# Patient Record
Sex: Female | Born: 1935 | Race: White | Hispanic: No | Smoking: Never smoker
Health system: Southern US, Community
[De-identification: ages and names within clinical notes are randomized; demographics above are authoritative.]

## PROBLEM LIST (undated history)

## (undated) DIAGNOSIS — Z853 Personal history of malignant neoplasm of breast: Secondary | ICD-10-CM

## (undated) DIAGNOSIS — N189 Chronic kidney disease, unspecified: Secondary | ICD-10-CM

## (undated) DIAGNOSIS — D649 Anemia, unspecified: Secondary | ICD-10-CM

## (undated) DIAGNOSIS — I739 Peripheral vascular disease, unspecified: Secondary | ICD-10-CM

## (undated) DIAGNOSIS — Z803 Family history of malignant neoplasm of breast: Secondary | ICD-10-CM

## (undated) DIAGNOSIS — Z801 Family history of malignant neoplasm of trachea, bronchus and lung: Secondary | ICD-10-CM

## (undated) DIAGNOSIS — N183 Chronic kidney disease, stage 3 unspecified: Secondary | ICD-10-CM

## (undated) DIAGNOSIS — Z923 Personal history of irradiation: Secondary | ICD-10-CM

## (undated) DIAGNOSIS — I442 Atrioventricular block, complete: Secondary | ICD-10-CM

## (undated) DIAGNOSIS — C50919 Malignant neoplasm of unspecified site of unspecified female breast: Secondary | ICD-10-CM

## (undated) DIAGNOSIS — M1990 Unspecified osteoarthritis, unspecified site: Secondary | ICD-10-CM

## (undated) DIAGNOSIS — I209 Angina pectoris, unspecified: Secondary | ICD-10-CM

## (undated) DIAGNOSIS — I10 Essential (primary) hypertension: Secondary | ICD-10-CM

## (undated) DIAGNOSIS — Z950 Presence of cardiac pacemaker: Secondary | ICD-10-CM

## (undated) DIAGNOSIS — Z1379 Encounter for other screening for genetic and chromosomal anomalies: Secondary | ICD-10-CM

## (undated) DIAGNOSIS — R42 Dizziness and giddiness: Secondary | ICD-10-CM

## (undated) DIAGNOSIS — E785 Hyperlipidemia, unspecified: Secondary | ICD-10-CM

## (undated) DIAGNOSIS — E7800 Pure hypercholesterolemia, unspecified: Secondary | ICD-10-CM

## (undated) DIAGNOSIS — C50412 Malignant neoplasm of upper-outer quadrant of left female breast: Secondary | ICD-10-CM

## (undated) DIAGNOSIS — Z170 Estrogen receptor positive status [ER+]: Secondary | ICD-10-CM

## (undated) DIAGNOSIS — M810 Age-related osteoporosis without current pathological fracture: Secondary | ICD-10-CM

## (undated) HISTORY — PX: GANGLION CYST EXCISION: SHX1691

## (undated) HISTORY — DX: Chronic kidney disease, stage 3 unspecified: N18.30

## (undated) HISTORY — DX: Pure hypercholesterolemia, unspecified: E78.00

## (undated) HISTORY — PX: EXCISION MORTON'S NEUROMA: SHX5013

## (undated) HISTORY — PX: ABDOMINAL HYSTERECTOMY: SHX81

## (undated) HISTORY — DX: Peripheral vascular disease, unspecified: I73.9

## (undated) HISTORY — DX: Anemia, unspecified: D64.9

## (undated) HISTORY — DX: Hyperlipidemia, unspecified: E78.5

## (undated) HISTORY — DX: Chronic kidney disease, stage 3 (moderate): N18.3

## (undated) HISTORY — PX: APPENDECTOMY: SHX54

## (undated) HISTORY — DX: Personal history of malignant neoplasm of breast: Z85.3

## (undated) HISTORY — DX: Presence of cardiac pacemaker: Z95.0

## (undated) HISTORY — DX: Family history of malignant neoplasm of trachea, bronchus and lung: Z80.1

## (undated) HISTORY — DX: Estrogen receptor positive status (ER+): Z17.0

## (undated) HISTORY — DX: Dizziness and giddiness: R42

## (undated) HISTORY — DX: Angina pectoris, unspecified: I20.9

## (undated) HISTORY — DX: Encounter for other screening for genetic and chromosomal anomalies: Z13.79

## (undated) HISTORY — DX: Family history of malignant neoplasm of breast: Z80.3

## (undated) HISTORY — DX: Malignant neoplasm of upper-outer quadrant of left female breast: C50.412

## (undated) HISTORY — PX: TOTAL HIP ARTHROPLASTY: SHX124

---

## 1983-10-13 DIAGNOSIS — Z923 Personal history of irradiation: Secondary | ICD-10-CM

## 1983-10-13 HISTORY — PX: BREAST LUMPECTOMY: SHX2

## 1983-10-13 HISTORY — DX: Personal history of irradiation: Z92.3

## 1998-02-04 ENCOUNTER — Ambulatory Visit (HOSPITAL_COMMUNITY): Admission: RE | Admit: 1998-02-04 | Discharge: 1998-02-04 | Payer: Self-pay | Admitting: Family Medicine

## 1998-04-08 ENCOUNTER — Ambulatory Visit (HOSPITAL_COMMUNITY): Admission: RE | Admit: 1998-04-08 | Discharge: 1998-04-08 | Payer: Self-pay | Admitting: Family Medicine

## 1998-07-24 ENCOUNTER — Other Ambulatory Visit: Admission: RE | Admit: 1998-07-24 | Discharge: 1998-07-24 | Payer: Self-pay | Admitting: Family Medicine

## 1998-09-04 ENCOUNTER — Ambulatory Visit (HOSPITAL_COMMUNITY): Admission: RE | Admit: 1998-09-04 | Discharge: 1998-09-04 | Payer: Self-pay | Admitting: Family Medicine

## 1999-08-01 ENCOUNTER — Other Ambulatory Visit: Admission: RE | Admit: 1999-08-01 | Discharge: 1999-08-01 | Payer: Self-pay | Admitting: Family Medicine

## 1999-09-05 ENCOUNTER — Encounter: Admission: RE | Admit: 1999-09-05 | Discharge: 1999-09-05 | Payer: Self-pay | Admitting: Family Medicine

## 1999-09-05 ENCOUNTER — Encounter: Payer: Self-pay | Admitting: Family Medicine

## 2000-11-04 ENCOUNTER — Encounter: Payer: Self-pay | Admitting: Family Medicine

## 2000-11-04 ENCOUNTER — Encounter: Admission: RE | Admit: 2000-11-04 | Discharge: 2000-11-04 | Payer: Self-pay | Admitting: Family Medicine

## 2001-01-31 ENCOUNTER — Encounter (INDEPENDENT_AMBULATORY_CARE_PROVIDER_SITE_OTHER): Payer: Self-pay

## 2001-01-31 ENCOUNTER — Ambulatory Visit (HOSPITAL_COMMUNITY): Admission: RE | Admit: 2001-01-31 | Discharge: 2001-01-31 | Payer: Self-pay | Admitting: Gastroenterology

## 2001-08-23 ENCOUNTER — Other Ambulatory Visit: Admission: RE | Admit: 2001-08-23 | Discharge: 2001-08-23 | Payer: Self-pay | Admitting: Obstetrics and Gynecology

## 2001-11-07 ENCOUNTER — Encounter: Admission: RE | Admit: 2001-11-07 | Discharge: 2001-11-07 | Payer: Self-pay | Admitting: Obstetrics and Gynecology

## 2001-11-07 ENCOUNTER — Encounter: Payer: Self-pay | Admitting: Obstetrics and Gynecology

## 2002-09-11 ENCOUNTER — Other Ambulatory Visit: Admission: RE | Admit: 2002-09-11 | Discharge: 2002-09-11 | Payer: Self-pay | Admitting: *Deleted

## 2002-12-19 ENCOUNTER — Encounter: Admission: RE | Admit: 2002-12-19 | Discharge: 2002-12-19 | Payer: Self-pay | Admitting: *Deleted

## 2003-12-28 ENCOUNTER — Encounter: Admission: RE | Admit: 2003-12-28 | Discharge: 2003-12-28 | Payer: Self-pay | Admitting: *Deleted

## 2005-01-13 ENCOUNTER — Encounter: Admission: RE | Admit: 2005-01-13 | Discharge: 2005-01-13 | Payer: Self-pay | Admitting: *Deleted

## 2005-12-24 ENCOUNTER — Encounter: Admission: RE | Admit: 2005-12-24 | Discharge: 2005-12-24 | Payer: Self-pay | Admitting: *Deleted

## 2006-01-05 ENCOUNTER — Encounter: Admission: RE | Admit: 2006-01-05 | Discharge: 2006-01-05 | Payer: Self-pay | Admitting: Cardiology

## 2006-02-18 ENCOUNTER — Encounter: Admission: RE | Admit: 2006-02-18 | Discharge: 2006-02-18 | Payer: Self-pay | Admitting: *Deleted

## 2006-12-30 ENCOUNTER — Encounter: Admission: RE | Admit: 2006-12-30 | Discharge: 2006-12-30 | Payer: Self-pay | Admitting: *Deleted

## 2007-07-07 ENCOUNTER — Encounter: Admission: RE | Admit: 2007-07-07 | Discharge: 2007-07-07 | Payer: Self-pay | Admitting: Sports Medicine

## 2007-11-02 ENCOUNTER — Inpatient Hospital Stay (HOSPITAL_COMMUNITY): Admission: RE | Admit: 2007-11-02 | Discharge: 2007-11-06 | Payer: Self-pay | Admitting: Orthopedic Surgery

## 2008-01-02 ENCOUNTER — Encounter: Admission: RE | Admit: 2008-01-02 | Discharge: 2008-01-02 | Payer: Self-pay | Admitting: Obstetrics and Gynecology

## 2008-10-08 ENCOUNTER — Inpatient Hospital Stay (HOSPITAL_COMMUNITY): Admission: RE | Admit: 2008-10-08 | Discharge: 2008-10-11 | Payer: Self-pay | Admitting: Orthopedic Surgery

## 2008-10-12 HISTORY — PX: JOINT REPLACEMENT: SHX530

## 2008-10-17 ENCOUNTER — Ambulatory Visit: Payer: Self-pay | Admitting: Vascular Surgery

## 2008-10-17 ENCOUNTER — Encounter (INDEPENDENT_AMBULATORY_CARE_PROVIDER_SITE_OTHER): Payer: Self-pay | Admitting: Orthopedic Surgery

## 2008-10-17 ENCOUNTER — Ambulatory Visit (HOSPITAL_COMMUNITY): Admission: RE | Admit: 2008-10-17 | Discharge: 2008-10-17 | Payer: Self-pay | Admitting: Orthopedic Surgery

## 2009-01-08 ENCOUNTER — Encounter: Admission: RE | Admit: 2009-01-08 | Discharge: 2009-01-08 | Payer: Self-pay | Admitting: Family Medicine

## 2009-07-04 ENCOUNTER — Encounter: Admission: RE | Admit: 2009-07-04 | Discharge: 2009-07-04 | Payer: Self-pay | Admitting: Family Medicine

## 2009-08-20 ENCOUNTER — Inpatient Hospital Stay (HOSPITAL_COMMUNITY): Admission: RE | Admit: 2009-08-20 | Discharge: 2009-08-22 | Payer: Self-pay | Admitting: Orthopedic Surgery

## 2009-11-10 ENCOUNTER — Ambulatory Visit (HOSPITAL_COMMUNITY): Admission: RE | Admit: 2009-11-10 | Discharge: 2009-11-10 | Payer: Self-pay | Admitting: Emergency Medicine

## 2010-01-13 ENCOUNTER — Encounter: Admission: RE | Admit: 2010-01-13 | Discharge: 2010-01-13 | Payer: Self-pay | Admitting: Family Medicine

## 2010-12-24 ENCOUNTER — Other Ambulatory Visit: Payer: Self-pay | Admitting: Family Medicine

## 2010-12-24 DIAGNOSIS — Z1231 Encounter for screening mammogram for malignant neoplasm of breast: Secondary | ICD-10-CM

## 2010-12-29 LAB — CREATININE, SERUM
Creatinine, Ser: 1.48 mg/dL — ABNORMAL HIGH (ref 0.4–1.2)
GFR calc non Af Amer: 34 mL/min — ABNORMAL LOW (ref >60)

## 2011-01-14 LAB — URINALYSIS, ROUTINE W REFLEX MICROSCOPIC
Glucose, UA: NEGATIVE mg/dL
Hgb urine dipstick: NEGATIVE
Ketones, ur: NEGATIVE mg/dL
Nitrite: NEGATIVE
Specific Gravity, Urine: 1.015 (ref 1.005–1.030)
Urobilinogen, UA: 0.2 mg/dL (ref 0.0–1.0)

## 2011-01-14 LAB — BASIC METABOLIC PANEL
BUN: 19 mg/dL (ref 6–23)
BUN: 23 mg/dL (ref 6–23)
CO2: 27 mEq/L (ref 19–32)
Calcium: 8 mg/dL — ABNORMAL LOW (ref 8.4–10.5)
Chloride: 105 mEq/L (ref 96–112)
Chloride: 110 mEq/L (ref 96–112)
GFR calc Af Amer: 43 mL/min — ABNORMAL LOW (ref >60)
GFR calc Af Amer: 52 mL/min — ABNORMAL LOW (ref >60)
GFR calc non Af Amer: 36 mL/min — ABNORMAL LOW (ref >60)
GFR calc non Af Amer: 39 mL/min — ABNORMAL LOW (ref >60)
GFR calc non Af Amer: 43 mL/min — ABNORMAL LOW (ref >60)
Glucose, Bld: 101 mg/dL — ABNORMAL HIGH (ref 70–99)
Glucose, Bld: 102 mg/dL — ABNORMAL HIGH (ref 70–99)
Glucose, Bld: 106 mg/dL — ABNORMAL HIGH (ref 70–99)
Potassium: 4.3 mEq/L (ref 3.5–5.1)
Potassium: 5.1 mEq/L (ref 3.5–5.1)

## 2011-01-14 LAB — CBC
HCT: 22.6 % — ABNORMAL LOW (ref 36.0–46.0)
HCT: 25.1 % — ABNORMAL LOW (ref 36.0–46.0)
HCT: 33.5 % — ABNORMAL LOW (ref 36.0–46.0)
Hemoglobin: 11.4 g/dL — ABNORMAL LOW (ref 12.0–15.0)
Hemoglobin: 8.6 g/dL — ABNORMAL LOW (ref 12.0–15.0)
MCHC: 34 g/dL (ref 30.0–36.0)
MCV: 97.3 fL (ref 78.0–100.0)
MCV: 98.6 fL (ref 78.0–100.0)
MCV: 99.1 fL (ref 78.0–100.0)
Platelets: 169 10*3/uL (ref 150–400)
RDW: 12.4 % (ref 11.5–15.5)
RDW: 13.6 % (ref 11.5–15.5)
RDW: 13.9 % (ref 11.5–15.5)
WBC: 5.8 10*3/uL (ref 4.0–10.5)

## 2011-01-14 LAB — PROTIME-INR: INR: 0.98 (ref 0.00–1.49)

## 2011-01-14 LAB — TYPE AND SCREEN: ABO/RH(D): O POS

## 2011-01-14 LAB — DIFFERENTIAL
Eosinophils Absolute: 0.2 10*3/uL (ref 0.0–0.7)
Lymphocytes Relative: 34 % (ref 12–46)
Monocytes Absolute: 0.4 10*3/uL (ref 0.1–1.0)
Neutro Abs: 3.1 10*3/uL (ref 1.7–7.7)

## 2011-01-14 LAB — APTT: aPTT: 29 seconds (ref 24–37)

## 2011-01-16 ENCOUNTER — Ambulatory Visit: Payer: Self-pay

## 2011-02-17 ENCOUNTER — Ambulatory Visit
Admission: RE | Admit: 2011-02-17 | Discharge: 2011-02-17 | Disposition: A | Discharge status: Home / Self Care | Payer: Medicare Other | Source: Ambulatory Visit | Attending: Family Medicine | Admitting: Family Medicine

## 2011-02-17 DIAGNOSIS — Z1231 Encounter for screening mammogram for malignant neoplasm of breast: Secondary | ICD-10-CM

## 2011-02-24 NOTE — H&P (Signed)
[name], [name]               ACCOUNT NO.:  [id number]   MEDICAL RECORD NO.:  [id number]          PATIENT TYPE:  INP   LOCATION:  NA                           FACILITY:  [facility]   PHYSICIAN:  Ollen Gross, M.D.    DATE OF BIRTH:  1929-04-08   DATE OF ADMISSION:  11/02/2007  DATE OF DISCHARGE:                              HISTORY & PHYSICAL   CHIEF COMPLAINT:  Left hip pain.   HISTORY OF PRESENT ILLNESS:  The patient is a 77 year old female who has  been seen by Dr. Lequita Halt in second opinion for ongoing left hip pain.  She has had intermittent pain for quite some time now and but over the  past several months it has been increasing in severity.  It is very hard  to get into a comfortable position.  She has been worked up in the past  and had MRI showing large fusions and degenerative changes.  She has  undergone a cortisone injection which would  only help for a few days.  She continues to have progressive pain.  She is seen in the office where  x-rays show essentially very minimal joint space basically contacting  bone-on-bone.  These are compared to films from several months ago and  has shown much deterioration and degeneration in the joint space, and  loss of joint space.  It was felt due to the progression of her disease  that she would benefit undergoing surgical intervention.  The risks and  benefits were discussed.  The patient, as such, was admitted to the  hospital.   ALLERGIES:  ACE INHIBITORS give her a nonproductive cough. COZAAR gives  her headache and vertigo.   CURRENT MEDICATIONS:  1. Atenolol.  2. Micardis.  3. Lipitor.  4. Claritin.  5. Glucosamine chondroitin.  6. Tylenol arthritis.  7. Baby aspirin.  8. Calcium plus D.  9. Vicodin.  10.A muscle relaxant.   PAST MEDICAL HISTORY:  History of bronchitis, hypertension,  hyperlipidemia, varicose veins, history of breast cancer on the left  side, osteopenia, cervical degenerative disk disease.   Childhood  illnesses including measles and mumps.   PAST SURGICAL HISTORY:  Appendectomy, hysterectomy, breast cancer  surgery, neuroma surgery x2.   FAMILY HISTORY:  Father with history of cerebral hemorrhage.  Mother age  19.   SOCIAL HISTORY:  Divorced, retired, nonsmoker, occasional intake of  wine. Three children living.  She lives alone. One of her children will  be assisting her with her care. She lives in a condo with one level.   REVIEW OF SYSTEMS:  GENERAL:  No fevers, chills, night sweats.  NEURO:  No seizures, syncope or paralysis.  A little bit of mild hearing loss.  RESPIRATORY:  No shortness breath, productive cough or hemoptysis.  CARDIOVASCULAR: No chest pain, angina, orthopnea.  GI: No nausea,  vomiting, diarrhea or constipation.  GU: A little bit of urinary  frequency.  No dysuria or hematuria.  MUSCULOSKELETAL:  Morning  stiffness and joint pain.   PHYSICAL EXAMINATION:  VITAL SIGNS: Pulse 52, respirations 12, blood  pressure 135/60.  GENERAL: This is  a 77 year old female well-nourished, well-developed, in  no acute distress, alert, oriented and cooperative, pleasant.  Excellent  historian.  She is accompanied by her daughter.  HEENT: Normocephalic, atraumatic.  Pupils round and reactive.  Oropharynx clear.  EOMs intact.  NECK:  Supple.  CHEST: Clear anterior posterior chest walls.  HEART:  Regular rate and rhythm.  Very faint early systolic ejection  murmur best heard over aortic point.  S1, S2 is noted.  ABDOMEN: Soft, nontender.  Bowel sounds present.  GENITALIA:  Not done.  EXTREMITIES:  Left hip flexion 90, zero internal rotation, about 20  degrees external rotation and about 20 degrees abduction.   IMPRESSION:  1. Osteoarthritis left hip.  2. History of bronchitis.  3. Hypertension.  4. Hyperlipidemia.  5. History of breast cancer.  6. Osteopenia.  7. Cervical degenerative disk disease.   PLAN:  The patient was admitted to [facility]  to undergo a  left total hip replacement arthroplasty.  Surgery will be performed by  Dr. Ollen Gross.      Alexzandrew L. Perkins, P.A.C.      Ollen Gross, M.D.  Electronically Signed    ALP/MEDQ  D:  11/01/2007  T:  11/02/2007  Job:  [id number]   cc:   Ollen Gross, M.D.  Fax: [phone]   W. Viann Fish, M.D.  Fax: [phone]  Email: [email or web address]

## 2011-02-24 NOTE — Op Note (Signed)
[name], [name]               ACCOUNT NO.:  [id number]   MEDICAL RECORD NO.:  [id number]          PATIENT TYPE:  INP   LOCATION:  0003                         FACILITY:  [facility]   PHYSICIAN:  Madlyn Frankel. Charlann Boxer, M.D.  DATE OF BIRTH:  19-Dec-1928   DATE OF PROCEDURE:  10/08/2008  DATE OF DISCHARGE:                               OPERATIVE REPORT   PREOPERATIVE DIAGNOSIS:  Right hip osteoarthritis.   POSTOPERATIVE DIAGNOSIS:  Right hip osteoarthritis.   PROCEDURE:  Right total hip replacement.   COMPONENTS USED:  DePuy hip system size 54 Pinnacle cup, a 32 neutral  Marathon liner, 2 cancellous bone screws, a 6 high Trilock stem with a  32 +1 ball.   SURGEON:  Madlyn Frankel. Charlann Boxer, M.D.   ASSISTANT:  Yetta Glassman. Loreta Ave, PA   ANESTHESIA:  General.   BLOOD LOSS:  300.   DRAINS:  One.   COMPLICATIONS.:  None.   SPECIMEN:  None.   FINDINGS:  None.   INDICATION FOR THE PROCEDURE:  [name] is a very pleasant 77 year old  female with a successful left total hip placement.  She had progressive  pain in the right hip that was not responding to conservative measures.  She had been a patient of Dr. Ollen Gross and wished to have things  done a bit earlier than he could do on the schedule.  She was seen and  evaluated for that purpose.  Risks and benefits of hip replacement  procedure were discussed and reviewed.  Consent was obtained for the  above.   PROCEDURE IN DETAIL:  The patient was brought to the operative theater.  Once adequate anesthesia, preoperative antibiotics, Ancef administered,  the patient was positioned in the left lateral decubitus position with  the right side up carefully.  Bony prominences were padded.  The patient  was positioned in the pegboard positioner with the pelvis stable and  thorax stable.  We evaluated her lower extremity lengths both in a  supine and then in this lateral position in order to match her leg  lengths at the end of the case.  Once this  was done, time-out was  performed, identifying the patient and extremity.  We draped out the  perineum area with plastic drapes.  Prescrubbed and prepped and draped  the right hip.   The lateral-based incision was made for a posterior approach to the hip.  The iliotibial band and gluteal fascia were incised posteriorly.  The  short external rotators were identified and taken down seperately from  the posterior capsule.  An L capsulotomy was made preserving the  posterior capsule leaflet for later anatomic repair but also to protect  the sciatic nerve from retractors.   The head was dislocated.  I utilized a high offset neck with a 28 ball  to make a neck osteotomy, as she was relatively similar in her leg  lengths.  Following this neck osteotomy, I attended to the femur first.  The femoral canal was opened with a drill, then the hand reamer, was  then irrigated to prevent fat emboli.  I then began  broaching with a 2  broach set in anteversion of about 20 degrees.  I broached up initially  all the way to a size 5 using the calcar planer to finish off a bit of  the anterior neck cut.   At this point I packed the femur with a sponge and attended to the  acetabulum.  Acetabular exposure was obtained,  retractors placed,  labrum debrided.  I began reaming with the 44 reamer and went up  sequentially to a 49 reamer and then 51.  The other hip had had a 52-mm  cup, however, at this point in my reaming there was still sclerotic  bone.  I decided to ream up to a 53 reamer.  With this I was able to  expose more of the subchondral bone, giving me a better chance of bony  ingrowth.  I impacted a 54 cup, putting it in approximately 40 degrees  of abduction and 20 degrees forward flexion, positioned anatomically so  that it was beneath the anterior rim anteriorly, and there was a bit of  the cup exposed superolateral.  With this I went ahead and placed 2  cancellous screws to support the initial  scratch fit.  I placed a  neutral trial liner.  Trial reduction was now carried out with this 5  broach, 5 offset neck and a 32-1 ball.  With this the leg length seemed  to be a little bit short on this side, there was a few millimeters of  shuck.  I went ahead and removed these trial components, removed the  trial liner, placed a central hole eliminator, and placed the final 32  neutral liner as my hip range of motion was excellent without evidence  of impingement.   The final 32 neutral liner was impacted without difficulty, visualized  seating of the plastic.   We now attended back to the femur.  I went ahead and broached with the 5  a bit lower and then impacted the  size 6 broach.  This sized the level  of my neck cut.  We chose to open up the 6 high offset stem.  It was  impacted at the level of the neck cut.  I retrialed the 32-1 ball.  At  this point there was about a millimeter or so of shuck, the leg lengths  appeared to be exactly as we had measured in the preoperative  positioning.  The hip range of motion was excellent with the combined  anteversion of approximately 50 degrees, no evidence of impingement with  external rotation, extension, abduction, external rotation, and no  evidence of subluxation with forward flexion and internal rotation.  Given these parameters, the final 32 +1 ball was impacted under a clean  and dry trunnion.  The hip was reduced.  We had irrigated the hip  throughout the case and again at this point.  I reapproximated posterior  capsule to the superior leaflet of tissue with a #1 Vicryl.  I placed a  medium Hemovac drain deep and reapproximated the iliotibial band and  gluteal fascia using #1 Vicryl.  The remainder of the wound was closed  with 2-0 Vicryl and running 4-0 Monocryl.  The hip was cleaned, dried,  and dressed sterilely with Steri-Strips and a Mepilex dressing.  She was  then brought to the recovery room in stable condition, tolerated  the  procedure well.      Madlyn Frankel Charlann Boxer, M.D.  Electronically Signed  MDO/MEDQ  D:  10/08/2008  T:  10/08/2008  Job:  [id number]

## 2011-02-24 NOTE — Discharge Summary (Signed)
NAMESTAYCE, DELANCY               ACCOUNT NO.:  [id number]   MEDICAL RECORD NO.:  [id number]          PATIENT TYPE:  INP   LOCATION:  1607                         FACILITY:  [facility]   PHYSICIAN:  Ollen Gross, M.D.    DATE OF BIRTH:  28-Jan-1929   DATE OF ADMISSION:  11/02/2007  DATE OF DISCHARGE:  11/06/2007                               DISCHARGE SUMMARY   ADMITTING DIAGNOSIS:  1. Osteoarthritis, left hip.  2. History of bronchitis.  3. Hypertension.  4. Hyperlipidemia.  5. History of breast cancer.  6. Osteopenia.  7. Cervical degenerative disk disease.   DISCHARGE DIAGNOSIS:  1. Osteoarthritis left hip status post left total hip arthroplasty.  2. Preop anemia.  3. Acute blood loss anemia on top of probable chronic anemia.  4. Status post transfusion without sequelae.  5. Postop hyponatremia, improving.  6. History of bronchitis.  7. Hypertension.  8. Hyperlipidemia.  9. History of breast cancer.  10.Osteopenia.  11.Cervical degenerative disk disease.   PROCEDURE:  November 02, 2007 left total hip.  Surgeon Dr. Lequita Halt,  assistant Avel Peace, PA-C.   ANESTHESIA:  General.   CONSULTS:  None.   BRIEF HISTORY:  Ms. Bellino is a 77 year old female with end-stage  arthritis of the left hip with aggressive worsening pain and dysfunction  who now presents for a total hip arthroplasty.   LABORATORY DATA:  Preop CBC showed a low hemoglobin of 11.6, hematocrit  of 33.5, white cell count 7.9, platelets elevated at 453. Serial CBCs  were followed.  Hemoglobin did drop down to 8.3. She was given 2 units  of blood back up to 9.6, drifted back down. Last noted H&H was 8.8 and  25.1.  PT/PTT preop 13.9 and .33 respectively.  INR 1.1.  Serial  protimes followed.  Last noted PT/INR 38.0 and 3.7.  Chem panel on  admission slightly elevated BUN of 25 and elevated creatinine 1.3.  Remaining Chem panel all within normal limits.  Serial BMETs were  followed.  BUN came down to normal  levels of 13, creatinine came down to  normal level of 1.06.  Sodium started out at 138, drifted down to 129  and  was improving.  Had come back up already to 132.  Preop UA, small  leukocyte esterase, 0-2 white cells, otherwise negative.  Blood group  type O+.   X-RAYS:  Left hip film October 27, 2007, left worse than right  osteoarthritis, no acute findings.  Chest x-ray preop October 27, 2007  no acute disease.  Postop hip pelvis film no complications after left  total hip.   EKG dated September 15, 2007 sinus rhythm was unconfirmed.   Stress test dated January 06, 2006 normal stress Cardiolite study with no  evidence of ischemia or infarction, normal quantitative gated SPECT,  ejection fraction of 73% with normal wall motion and normal wall  thickening   HOSPITAL COURSE:  The patient admitted to [facility],  tolerated procedure well, later transferred to the recovery room on the  orthopedic floor started on PCA and p.o. analgesics for pain control  following surgery.  She was actually doing pretty well on the morning of  day one. She did very well through the night after surgery with  virtually no pain.  She was noted to have chronic anemia preop. We did  have be give her blood and continue her iron supplement because her  hemoglobin dropped down to 8.3.  She did have excellent urinary output.  Her sodium was down a little bit. She was diuresing her fluids very  well. Resumed he blood pressure meds but they were put on parameters  since she was normotensive postop without having taken them the first  day. By day two what little pain she did have was improved, dressing was  changed, the incision looked good.  The sodium was down a little bit  further. On January 29, we discontinued her fluids. Hemoglobin was back  up to 9.6.  She was getting up and walking about 70 feet, progressing  well with therapy.  She continued to receive therapy on a daily basis  through the weekend.   Sodium was still a little low on postop day #3.  INR was slowly titrating up. She continued to progress with therapy and  by postop day #4 sodium had turned around and had improved up to 132.  She was ambulating well with physical therapy, no complaints and was  discharged home.   DISCHARGE PLAN:  1. The patient discharged on November 06, 2007.  2. Discharge diagnoses, please see above.  3. Discharge medications:  Percocet, Robaxin, Nu-Iron, Coumadin.   ACTIVITY:  Partial weightbearing left lower extremity.  Home health PT,  home health nursing, total hip protocol, hip cautions, may start  showering, do not submerge the incision under water.  Follow-up in 2  weeks.   DISPOSITION:  Home.   CONDITION ON DISCHARGE:  Improving.      Alexzandrew L. Perkins, P.A.C.      Ollen Gross, M.D.  Electronically Signed    ALP/MEDQ  D:  12/20/2007  T:  12/21/2007  Job:  [id number]   cc:   Georga Hacking, M.D.  Fax: [phone]  Email: [email or web address]

## 2011-02-24 NOTE — Op Note (Signed)
[name], [name]               ACCOUNT NO.:  [id number]   MEDICAL RECORD NO.:  [id number]          PATIENT TYPE:  INP   LOCATION:  0005                         FACILITY:  [facility]   PHYSICIAN:  Ollen Gross, M.D.    DATE OF BIRTH:  [birth year]   DATE OF PROCEDURE:  11/02/2007  DATE OF DISCHARGE:                               OPERATIVE REPORT   PREOPERATIVE DIAGNOSIS:  Osteoarthritis, left hip.   POSTOPERATIVE DIAGNOSIS:  Osteoarthritis, left hip   PROCEDURE:  Left total hip arthroplasty.   SURGEON:  Ollen Gross, M.D.   ASSISTANT:  Alexzandrew L. Perkins, P.A.C.   ANESTHESIA:  General.   ESTIMATED BLOOD LOSS:  500 mL.   DRAINS:  Hemovac x1.   COMPLICATIONS:  None.   CONDITION:  Stable to the recovery room.   CLINICAL NOTE:  [name] is a 77 year old female with end stage  arthritis of the left hip with progressively worsening pain and  dysfunction.  She presents now for left total hip arthroplasty.   PROCEDURE IN DETAIL:  After the successful administration of general  anesthetic, the patient is placed in the right lateral decubitus  position with the left side up and held with the hip positioner.  The  left lower extremity isolated from her perineum with plastic drapes and  prepped and draped in the usual sterile fashion.  A short posterolateral  incision is made with a 10 blade through subcutaneous tissue to the  level of the fascia lata which was incised in line with the skin  incision.  The sciatic nerve is palpated and protected and the short  rotators isolated off the femur.  Capsulectomy is performed and the hip  was dislocated.  The center of the femoral head is marked and a trial  prosthesis was placed such that the center of the trial head corresponds  to the center of native femoral head.  The osteotomy line is marked on  the femoral neck and osteotomy made with an oscillating saw.  The  femoral head is removed and the femur retracted anteriorly to gain  acetabular exposure.   The acetabular retractors were placed and labrum and osteophytes were  removed.  The reaming begins at 45 mm in coursing increments of 2 up to  51 mm then a 52 mm Pinnacle acetabular shell was placed in anatomic  position and transfixed with two dome screws.  A trial 32 mm neutral  liner was placed.   The femur was prepared with the canal finder and irrigation.  Axial  reaming is performed up to 13.5 mm, proximal reaming to an 21F and the  sleeve machined to a large.  An 21F large trial sleeve is placed with an  18 x 13 stem and a 36 plus 8 neck matching native anteversion.  A 32  plus 0 head is placed.  The hip is reduced with excellent stability.  There was full extension, flexion, external rotation, 70 degrees  flexion, 40 degrees adduction, 90 degrees internal rotation, and 90  degrees of flexion and 70 degrees of internal rotation.  By placing the  left  hip on top of the right, it felt as though the leg lengths were  equal.   The hip was dislocated and all trials were removed.  The permanent apex  hole eliminator is placed and the permanent 32 mm neutral liner is  placed.  On the femoral ,side we placed the 51F large sleeve with the 18  x 13 stem and 36 plus 8 neck matching native anteversion.  The 32 plus 0  head is placed and the hip is reduced with the same stability  parameters.  The wound was copiously irrigated with saline solution and  the short rotators reattached to the femur through drill holes.  The  fascia lata was closed over with a Hemovac drain with interrupted #1  Vicryl, subcu closed with #1 and 2-0 Vicryl and subcuticular running 4-0  Monocryl.  The drain was hooked to suction, the incision cleaned and  dried, and Steri-Strips and a bulky sterile dressing applied.  The  patient was placed into a knee immobilizer, awakened, and transferred to  the recovery room in stable condition.      Ollen Gross, M.D.  Electronically  Signed     FA/MEDQ  D:  11/02/2007  T:  11/02/2007  Job:  [id number]

## 2011-02-27 NOTE — H&P (Signed)
NAMESKY, PRIMO NO.:  [id number]   MEDICAL RECORD NO.:  [id number]          PATIENT TYPE:  INP   LOCATION:                               FACILITY:  [facility]   PHYSICIAN:  Madlyn Frankel. Charlann Boxer, M.D.  DATE OF BIRTH:  August 06, 1929   DATE OF ADMISSION:  10/08/2008  DATE OF DISCHARGE:                              HISTORY & PHYSICAL   PROCEDURE:  Right total hip replacement.   CHIEF COMPLAINT:  Right hip pain.   HISTORY OF PRESENT ILLNESS:  A 77 year old female with a history of  right hip pain secondary to osteoarthritis.  She does have a history of  a left total hip replacement without any complications.  Her right hip  has been refractory to all conservative treatment.  She has been  presurgically assessed prior to her surgery.   PRIMARY CARE PHYSICIAN:  Juluis Rainier, MD   CARDIOLOGIST:  Georga Hacking, MD   PAST MEDICAL HISTORY:  Significant for:  1. Osteoarthritis.  2. Hypertension.  3. Hypercholesterolemia.  4. Breast cancer.  5. Urinary incontinence.   PAST SURGICAL HISTORY:  Appendectomy, ganglion cyst removal, Morton  neuroma removal, hysterectomy, breast cancer removal, left total hip  replacement in January of 2009.   FAMILY MEDICAL HISTORY:  Cerebrovascular accident (CVA), cancer.   SOCIAL HISTORY:  She is divorced and retired.  She does have a primary  caregiver in the home postoperatively.   DRUG ALLERGIES:  No known drug allergies.   MEDICATIONS:  1. Atenolol 25 mg one p.o. daily.  2. Micardis/HCTZ 80 mg 1/2 tab daily.  3. Lipitor 20 mg 1/2 tab daily.  4. Aspirin 81 mg p.o. daily.  5. Glucosamine chondroitin 2 daily.  6. Calcium plus vitamin D p.o. b.i.d.  7. Omega-3 1000 mg p.o. daily.  8. Claritin 10 mg one p.o. daily.  9. Tylenol Arthritis two p.o. p.r.n.   REVIEW OF SYSTEMS:  NEUROLOGICAL:  She has some balance problems.  DERMATOLOGY:  She has hives, allergic to mold.  GENITOURINARY:  She has  increased urinary frequency,  incontinence and increased urinating at  night.  MUSCULOSKELETAL:  She has muscle stiffness.  Otherwise see HPI.   PHYSICAL EXAMINATION:  VITAL SIGNS:  Pulse 72, respirations 16, blood  pressure 124/76.  GENERAL:  Awake, alert and oriented, well-developed, well-nourished.  HEENT:  Normocephalic.  NECK:  Supple.  No carotid bruits.  CHEST:  Lungs clear to auscultation bilaterally.  BREASTS:  Deferred.  HEART:  Regular rate and rhythm, S1, S2 distinct.  ABDOMEN:  Soft, nontender, nondistended.  Bowel sounds present.  GENITOURINARY:  Deferred.  EXTREMITIES:  Right hip has decreased range of motion and increased pain  with motion.  SKIN:  No cellulitis.  NEUROLOGICAL:  Intact distal sensibilities.   LABS:  Pending presurgical testing.   Cardiology, EKG and adenosine Cardiolite workup done by Dr. Donnie Aho.  This was due to EKG showing some minor nonspecific ST changes.  There  were no significant changes with adenosine infusion.  Recommendations  were though to decrease her atenolol to 25 mg daily in light of her  resting bradycardia.  Chest x-ray is pending.   IMPRESSION:  Right hip osteoarthritis.   PLAN:  Right total hip replacement at [facility] October 08, 2008, by surgeon Dr. Shelda Pal.  All risks and complications were  discussed.  Postoperative medications were provided today which include  Lovenox, Robaxin, iron, aspirin, Colace, MiraLax.  Pain medicines will  be dispensed at time of surgery.  The patient does request that no  occupational therapy be performed in the hospital.     ______________________________  Yetta Glassman. Loreta Ave, [place]      Madlyn Frankel. Charlann Boxer, M.D.  Electronically Signed    BLM/MEDQ  D:  09/20/2008  T:  09/20/2008  Job:  [id number]   cc:   Dossie Der, MD  Fax: [phone]   W. Viann Fish, M.D.  Fax: [phone]  Email: [email or web address]   Juluis Rainier, M.D.  Fax: [phone]

## 2011-02-27 NOTE — Discharge Summary (Signed)
[name], [name]               ACCOUNT NO.:  [id number]   MEDICAL RECORD NO.:  [id number]          PATIENT TYPE:  INP   LOCATION:  1602                         FACILITY:  [facility]   PHYSICIAN:  Madlyn Frankel. Charlann Boxer, M.D.  DATE OF BIRTH:  1929-10-02   DATE OF ADMISSION:  10/08/2008  DATE OF DISCHARGE:  10/11/2008                               DISCHARGE SUMMARY   ADMITTING DIAGNOSES:  1. Osteoarthritis.  2. Hypertension.  3. Hypercholesteremia.  4. Breast cancer.  5. Urinary incontinence.   DISCHARGE DIAGNOSES:  1. Osteoarthritis.  2. Hypertension.  3. Hypercholesteremia.  4. Breast cancer.  5. Urinary incontinence.  6. Acute blood loss anemia, treated with iron.   HISTORY OF PRESENT ILLNESS:  A 77 year old female with a history of  right hip pain secondary to osteoarthritis.  Has a history of a left  total hip replacement, has done well without any complication.  Her  right hip has been refractory to all conservative treatment.   CONSULTS:  None.   PROCEDURE:  A right total hip replacement by surgeon Dr. Durene Romans.  Assistant Dwyane Luo, P.A.-C.   LABORATORY DATA:  Labs, CBC final reading, white blood cell 10.1,  hemoglobin 8.2, hematocrit 24.4, platelets 176.  White cell differential  normal.  Coags normal.  Metabolic panel, sodium 134, potassium 4.2,  glucose 97, creatinine 1.11, GFR 48, calcium 8.9.  UA negative.   RADIOLOGY:  Postoperative portable pelvis showed uncomplicated new right  total hip arthroplasty.   HOSPITAL COURSE:  Patient underwent a right total hip replacement and  admitted to the orthopedic floor.  Her stay was unremarkable.  Pain was  controlled.  She made good progress with physical therapy.  Dressing was  changed on a daily basis.  No significant drainage from the wound.  By  day 3, she met all criteria for discharge home.  She had no hypotension  or any orthostatic hypotension.  Dressing was changed.  He was  weightbearing as tolerated and ready  for discharge home.   DISCHARGE DISPOSITION:  Discharged home in stable and improved  condition.   DISCHARGE PHYSICAL THERAPY:  Weightbearing as tolerated with use of a  rolling walker with home health care PT.   DISCHARGE DIET:  Heart healthy.   DISCHARGE WOUND CARE:  Keep dry.   DISCHARGE MEDICATION:  1. Aspirin 325 mg 1 p.o. b.i.d.  2. Robaxin 500 mg p.o. q.6.  3. Iron 325 mg p.o. t.i.d. x3 weeks.  4. Colace 100 mg p.o. b.i.d.  5. MiraLax 17 g p.o. daily.  6. Norco 7.5/325 one to two p.o. q.4 to 6 p.r.n. pain.  7. Micardis 80/25 one p.o. q.a.m.  8. Atenolol 50 mg 1 p.o. q.h.s.  9. Loratadine 10 mg 1 p.o. q.a.m.  10.Lipitor 20 mg 1/2 tab q.h.s.  11.Vitamin D 1000 units q.a.m.  12.Calcium plus vitamin D p.o. b.i.d.   DISCHARGE FOLLOWUP:  Follow up with Dr. Charlann Boxer at phone number [phone] in  2 weeks for wound check.     ______________________________  Yetta Glassman. [address], [place]      Madlyn Frankel. Charlann Boxer, M.D.  Electronically Signed    BLM/MEDQ  D:  11/05/2008  T:  11/05/2008  Job:  [id number]   cc:   Juluis Rainier, M.D.  Fax: [phone]   W. Viann Fish, M.D.  Fax: [phone]  Email: [email or web address]

## 2011-02-27 NOTE — Procedures (Signed)
[facility]  Patient:    [name], [name]                      MRN: [id number] Proc. Date: 01/31/01 Adm. Date:  [id number] Attending:  Rich Brave CC:         Vale Haven. Andrey Campanile, M.D.   Procedure Report  PROCEDURE:  Colonoscopy with polypectomy.  INDICATIONS FOR PROCEDURE:  A 77 year old female with family history of colon cancer, with negative examination five years ago.  FINDINGS:  Two small polyps removed. Considerable sigmoid diverticulosis and fixation of the colon.  DESCRIPTION OF PROCEDURE:  The nature, purpose and risk of the procedure were familiar from prior examination and she provided written consent. Sedation was fentanyl 75 mcg and Versed 9 mg IV prior to and during the course of the procedure without arrhythmias or desaturation. The Olympus adult video colonoscope was advanced with considerable difficulty due to sigmoid fixation and angulation. Ultimately, with the patient in the supine position and by doing slide by maneuvers cautiously, I was able to get through that area and advance them fairly easily the remainder of the way to the terminal ileum, where upon pull back was initiated.  During the insertion of the scope at about 17 cm, I encountered a small semipedunculated 4 mm polyp which I removed by snare technique without bleeding or evidence of excessive cautery. A small fragment of this lesion, most of which was simply fulgurated away, was retrieved for a histologic analysis from the tip of the snare.  There was also a small sessile polyp at about 60 cm removed by a single cold biopsy.  There was considerable sigmoid diverticulosis and associated fixation of the colon.  No large polyps, cancer, colitis, or vascular malformations were observed and retroflexion in the rectum as well as distal antegrade view into the rectum were unremarkable.  The patient tolerated the procedure well and there were no  apparent complications.  IMPRESSION: 1. Two small polyps removed as described above. 2. Severe sigmoid diverticular disease with associated colonic fixation.  PLAN: 1. Await pathology on the polyps. 2. Follow-up colonoscopy in three to five years in view of the family    history of colon cancer. Would consider use of the pediatric colonoscope    at that time. DD:  01/31/01 TD:  02/01/01 Job: [id number]

## 2011-07-02 LAB — COMPREHENSIVE METABOLIC PANEL
ALT: 14
Albumin: 3.7
Alkaline Phosphatase: 84
GFR calc Af Amer: 46 — ABNORMAL LOW
Potassium: 4.1
Sodium: 138
Total Protein: 7.1

## 2011-07-02 LAB — CBC
MCHC: 34.9
MCHC: 35
MCHC: 35.3
MCV: 92.4
Platelets: 194
Platelets: 207
Platelets: 259
Platelets: 453 — ABNORMAL HIGH
RBC: 2.51 — ABNORMAL LOW
RBC: 2.74 — ABNORMAL LOW
RDW: 12
RDW: 12.2
RDW: 14.1
WBC: 9.2

## 2011-07-02 LAB — BASIC METABOLIC PANEL
BUN: 12
BUN: 13
BUN: 9
BUN: 9
CO2: 27
CO2: 28
CO2: 28
Calcium: 7.8 — ABNORMAL LOW
Calcium: 7.8 — ABNORMAL LOW
Calcium: 7.9 — ABNORMAL LOW
Calcium: 8.5
Creatinine, Ser: 1.02
Creatinine, Ser: 1.06
Creatinine, Ser: 1.07
GFR calc Af Amer: >60
GFR calc Af Amer: >60
GFR calc non Af Amer: 50 — ABNORMAL LOW
GFR calc non Af Amer: 50 — ABNORMAL LOW
Glucose, Bld: 108 — ABNORMAL HIGH
Glucose, Bld: 109 — ABNORMAL HIGH
Glucose, Bld: 97
Potassium: 4.3

## 2011-07-02 LAB — TYPE AND SCREEN: ABO/RH(D): O POS

## 2011-07-02 LAB — PROTIME-INR
INR: 1.1
INR: 1.1
INR: 2.1 — ABNORMAL HIGH
INR: 3.7 — ABNORMAL HIGH
INR: 4.4 — ABNORMAL HIGH
Prothrombin Time: 14.5
Prothrombin Time: 43.8 — ABNORMAL HIGH

## 2011-07-02 LAB — HEMOGLOBIN AND HEMATOCRIT, BLOOD: Hemoglobin: 8.8 — ABNORMAL LOW

## 2011-07-02 LAB — URINALYSIS, ROUTINE W REFLEX MICROSCOPIC
Glucose, UA: NEGATIVE
Hgb urine dipstick: NEGATIVE
Specific Gravity, Urine: 1.02
Urobilinogen, UA: 0.2

## 2011-07-02 LAB — URINE MICROSCOPIC-ADD ON

## 2011-07-17 LAB — URINALYSIS, ROUTINE W REFLEX MICROSCOPIC
Bilirubin Urine: NEGATIVE
Hgb urine dipstick: NEGATIVE
Ketones, ur: NEGATIVE mg/dL
Nitrite: NEGATIVE
Protein, ur: NEGATIVE mg/dL
Urobilinogen, UA: 0.2 mg/dL (ref 0.0–1.0)

## 2011-07-17 LAB — CBC
Hemoglobin: 12 g/dL (ref 12.0–15.0)
Hemoglobin: 8.9 g/dL — ABNORMAL LOW (ref 12.0–15.0)
MCHC: 34 g/dL (ref 30.0–36.0)
MCHC: 34 g/dL (ref 30.0–36.0)
MCHC: 34.5 g/dL (ref 30.0–36.0)
MCV: 98.4 fL (ref 78.0–100.0)
Platelets: 262 10*3/uL (ref 150–400)
RBC: 2.67 MIL/uL — ABNORMAL LOW (ref 3.87–5.11)
RDW: 12.1 % (ref 11.5–15.5)
RDW: 12.1 % (ref 11.5–15.5)
RDW: 12.2 % (ref 11.5–15.5)

## 2011-07-17 LAB — DIFFERENTIAL
Basophils Absolute: 0 10*3/uL (ref 0.0–0.1)
Basophils Relative: 1 % (ref 0–1)
Lymphocytes Relative: 38 % (ref 12–46)
Monocytes Absolute: 0.4 10*3/uL (ref 0.1–1.0)
Neutro Abs: 2.7 10*3/uL (ref 1.7–7.7)
Neutrophils Relative %: 51 % (ref 43–77)

## 2011-07-17 LAB — BASIC METABOLIC PANEL
BUN: 23 mg/dL (ref 6–23)
CO2: 26 mEq/L (ref 19–32)
CO2: 27 mEq/L (ref 19–32)
CO2: 29 mEq/L (ref 19–32)
Calcium: 8.6 mg/dL (ref 8.4–10.5)
Calcium: 8.9 mg/dL (ref 8.4–10.5)
Calcium: 9.7 mg/dL (ref 8.4–10.5)
Chloride: 103 mEq/L (ref 96–112)
Creatinine, Ser: 1.09 mg/dL (ref 0.4–1.2)
Creatinine, Ser: 1.16 mg/dL (ref 0.4–1.2)
Creatinine, Ser: 1.21 mg/dL — ABNORMAL HIGH (ref 0.4–1.2)
GFR calc Af Amer: 55 mL/min — ABNORMAL LOW (ref >60)
GFR calc Af Amer: 58 mL/min — ABNORMAL LOW (ref >60)
GFR calc Af Amer: 59 mL/min — ABNORMAL LOW (ref >60)
GFR calc non Af Amer: 43 mL/min — ABNORMAL LOW (ref >60)
GFR calc non Af Amer: 48 mL/min — ABNORMAL LOW (ref >60)
Glucose, Bld: 104 mg/dL — ABNORMAL HIGH (ref 70–99)
Glucose, Bld: 126 mg/dL — ABNORMAL HIGH (ref 70–99)
Glucose, Bld: 92 mg/dL (ref 70–99)
Sodium: 134 mEq/L — ABNORMAL LOW (ref 135–145)

## 2011-07-17 LAB — APTT: aPTT: 35 seconds (ref 24–37)

## 2011-07-17 LAB — HEMOGLOBIN AND HEMATOCRIT, BLOOD
HCT: 24.4 % — ABNORMAL LOW (ref 36.0–46.0)
Hemoglobin: 8.2 g/dL — ABNORMAL LOW (ref 12.0–15.0)

## 2011-07-17 LAB — PROTIME-INR: Prothrombin Time: 13.3 seconds (ref 11.6–15.2)

## 2012-01-20 ENCOUNTER — Other Ambulatory Visit: Payer: Self-pay | Admitting: Family Medicine

## 2012-01-20 DIAGNOSIS — Z1231 Encounter for screening mammogram for malignant neoplasm of breast: Secondary | ICD-10-CM

## 2012-02-18 ENCOUNTER — Ambulatory Visit
Admission: RE | Admit: 2012-02-18 | Discharge: 2012-02-18 | Disposition: A | Discharge status: Home / Self Care | Payer: Medicare Other | Source: Ambulatory Visit | Attending: Family Medicine | Admitting: Family Medicine

## 2012-02-18 DIAGNOSIS — Z1231 Encounter for screening mammogram for malignant neoplasm of breast: Secondary | ICD-10-CM

## 2012-02-23 ENCOUNTER — Other Ambulatory Visit: Payer: Self-pay | Admitting: Family Medicine

## 2012-03-10 ENCOUNTER — Ambulatory Visit
Admission: RE | Admit: 2012-03-10 | Discharge: 2012-03-10 | Disposition: A | Discharge status: Home / Self Care | Payer: Medicare Other | Source: Ambulatory Visit | Attending: Family Medicine | Admitting: Family Medicine

## 2013-02-03 ENCOUNTER — Other Ambulatory Visit: Payer: Self-pay

## 2013-02-03 DIAGNOSIS — Z1231 Encounter for screening mammogram for malignant neoplasm of breast: Secondary | ICD-10-CM

## 2013-02-27 ENCOUNTER — Ambulatory Visit
Admission: RE | Admit: 2013-02-27 | Discharge: 2013-02-27 | Disposition: A | Discharge status: Home / Self Care | Payer: Medicare Other | Source: Ambulatory Visit

## 2013-02-27 DIAGNOSIS — Z1231 Encounter for screening mammogram for malignant neoplasm of breast: Secondary | ICD-10-CM

## 2014-02-22 ENCOUNTER — Other Ambulatory Visit: Payer: Self-pay

## 2014-02-22 DIAGNOSIS — Z1231 Encounter for screening mammogram for malignant neoplasm of breast: Secondary | ICD-10-CM

## 2014-03-21 ENCOUNTER — Ambulatory Visit: Payer: Medicare Other

## 2014-03-28 ENCOUNTER — Ambulatory Visit
Admission: RE | Admit: 2014-03-28 | Discharge: 2014-03-28 | Disposition: A | Discharge status: Home / Self Care | Payer: Medicare Other | Source: Ambulatory Visit

## 2014-03-28 DIAGNOSIS — Z1231 Encounter for screening mammogram for malignant neoplasm of breast: Secondary | ICD-10-CM

## 2014-07-03 ENCOUNTER — Other Ambulatory Visit: Payer: Self-pay | Admitting: Family Medicine

## 2014-07-03 ENCOUNTER — Ambulatory Visit
Admission: RE | Admit: 2014-07-03 | Discharge: 2014-07-03 | Disposition: A | Discharge status: Home / Self Care | Payer: Medicare Other | Source: Ambulatory Visit | Attending: Family Medicine | Admitting: Family Medicine

## 2014-07-03 DIAGNOSIS — R0789 Other chest pain: Secondary | ICD-10-CM

## 2014-11-12 ENCOUNTER — Encounter (HOSPITAL_COMMUNITY): Payer: Self-pay | Admitting: Emergency Medicine

## 2014-11-12 ENCOUNTER — Inpatient Hospital Stay (HOSPITAL_COMMUNITY)
Admission: EM | Admit: 2014-11-12 | Discharge: 2014-11-14 | DRG: 244 | Disposition: A | Discharge status: Skilled Nursing Facility | Payer: Medicare Other | Attending: Cardiology | Admitting: Cardiology

## 2014-11-12 DIAGNOSIS — N289 Disorder of kidney and ureter, unspecified: Secondary | ICD-10-CM

## 2014-11-12 DIAGNOSIS — Z959 Presence of cardiac and vascular implant and graft, unspecified: Secondary | ICD-10-CM

## 2014-11-12 DIAGNOSIS — R42 Dizziness and giddiness: Secondary | ICD-10-CM | POA: Diagnosis present

## 2014-11-12 DIAGNOSIS — N183 Chronic kidney disease, stage 3 unspecified: Secondary | ICD-10-CM | POA: Insufficient documentation

## 2014-11-12 DIAGNOSIS — E7800 Pure hypercholesterolemia, unspecified: Secondary | ICD-10-CM

## 2014-11-12 DIAGNOSIS — E785 Hyperlipidemia, unspecified: Secondary | ICD-10-CM | POA: Diagnosis present

## 2014-11-12 DIAGNOSIS — Z853 Personal history of malignant neoplasm of breast: Secondary | ICD-10-CM | POA: Diagnosis not present

## 2014-11-12 DIAGNOSIS — Z91048 Other nonmedicinal substance allergy status: Secondary | ICD-10-CM

## 2014-11-12 DIAGNOSIS — M810 Age-related osteoporosis without current pathological fracture: Secondary | ICD-10-CM | POA: Diagnosis present

## 2014-11-12 DIAGNOSIS — Z79899 Other long term (current) drug therapy: Secondary | ICD-10-CM | POA: Diagnosis not present

## 2014-11-12 DIAGNOSIS — I442 Atrioventricular block, complete: Secondary | ICD-10-CM

## 2014-11-12 DIAGNOSIS — I129 Hypertensive chronic kidney disease with stage 1 through stage 4 chronic kidney disease, or unspecified chronic kidney disease: Secondary | ICD-10-CM | POA: Diagnosis present

## 2014-11-12 DIAGNOSIS — I459 Conduction disorder, unspecified: Secondary | ICD-10-CM

## 2014-11-12 DIAGNOSIS — I10 Essential (primary) hypertension: Secondary | ICD-10-CM

## 2014-11-12 DIAGNOSIS — Z950 Presence of cardiac pacemaker: Secondary | ICD-10-CM | POA: Diagnosis present

## 2014-11-12 HISTORY — DX: Atrioventricular block, complete: I44.2

## 2014-11-12 HISTORY — DX: Age-related osteoporosis without current pathological fracture: M81.0

## 2014-11-12 HISTORY — DX: Essential (primary) hypertension: I10

## 2014-11-12 HISTORY — DX: Malignant neoplasm of unspecified site of unspecified female breast: C50.919

## 2014-11-12 HISTORY — DX: Pure hypercholesterolemia, unspecified: E78.00

## 2014-11-12 HISTORY — DX: Chronic kidney disease, unspecified: N18.9

## 2014-11-12 HISTORY — DX: Unspecified osteoarthritis, unspecified site: M19.90

## 2014-11-12 LAB — URINE MICROSCOPIC-ADD ON

## 2014-11-12 LAB — URINALYSIS, ROUTINE W REFLEX MICROSCOPIC
BILIRUBIN URINE: NEGATIVE
GLUCOSE, UA: NEGATIVE mg/dL
HGB URINE DIPSTICK: NEGATIVE
Ketones, ur: NEGATIVE mg/dL
Nitrite: NEGATIVE
PH: 6 (ref 5.0–8.0)
PROTEIN: NEGATIVE mg/dL
Specific Gravity, Urine: 1.015 (ref 1.005–1.030)
UROBILINOGEN UA: 0.2 mg/dL (ref 0.0–1.0)

## 2014-11-12 LAB — BASIC METABOLIC PANEL
Anion gap: 9 (ref 5–15)
BUN: 31 mg/dL — ABNORMAL HIGH (ref 6–23)
CALCIUM: 9.5 mg/dL (ref 8.4–10.5)
CO2: 25 mmol/L (ref 19–32)
Chloride: 106 mmol/L (ref 96–112)
Creatinine, Ser: 1.68 mg/dL — ABNORMAL HIGH (ref 0.50–1.10)
GFR calc Af Amer: 31 mL/min — ABNORMAL LOW (ref >90)
GFR, EST NON AFRICAN AMERICAN: 27 mL/min — AB (ref >90)
Glucose, Bld: 115 mg/dL — ABNORMAL HIGH (ref 70–99)
Potassium: 4.2 mmol/L (ref 3.5–5.1)
Sodium: 140 mmol/L (ref 135–145)

## 2014-11-12 LAB — CBC
HEMATOCRIT: 33.5 % — AB (ref 36.0–46.0)
HEMOGLOBIN: 11.1 g/dL — AB (ref 12.0–15.0)
MCH: 32 pg (ref 26.0–34.0)
MCHC: 33.1 g/dL (ref 30.0–36.0)
MCV: 96.5 fL (ref 78.0–100.0)
Platelets: 246 10*3/uL (ref 150–400)
RBC: 3.47 MIL/uL — AB (ref 3.87–5.11)
RDW: 12.4 % (ref 11.5–15.5)
WBC: 8.4 10*3/uL (ref 4.0–10.5)

## 2014-11-12 LAB — APTT: aPTT: 34 seconds (ref 24–37)

## 2014-11-12 LAB — MRSA PCR SCREENING: MRSA by PCR: NEGATIVE

## 2014-11-12 LAB — PROTIME-INR
INR: 1.07 (ref 0.00–1.49)
Prothrombin Time: 14 seconds (ref 11.6–15.2)

## 2014-11-12 LAB — TSH: TSH: 2.354 u[IU]/mL (ref 0.350–4.500)

## 2014-11-12 LAB — I-STAT TROPONIN, ED: Troponin i, poc: 0 ng/mL (ref 0.00–0.08)

## 2014-11-12 MED ORDER — SIMVASTATIN 10 MG PO TABS
10.0000 mg | ORAL_TABLET | Freq: Every day | ORAL | Status: DC
  Administered 2014-11-13 – 2014-11-14 (×2): 10 mg via ORAL
  Filled 2014-11-12 (×2): qty 1

## 2014-11-12 MED ORDER — ONDANSETRON HCL 4 MG/2ML IJ SOLN: 4.0000 mg | Freq: Four times a day (QID) | INTRAMUSCULAR | Status: DC | PRN

## 2014-11-12 MED ORDER — ACETAMINOPHEN 325 MG PO TABS: 650.0000 mg | ORAL_TABLET | ORAL | Status: DC | PRN

## 2014-11-12 MED ORDER — KETOROLAC TROMETHAMINE 0.5 % OP SOLN
1.0000 [drp] | Freq: Four times a day (QID) | OPHTHALMIC | Status: DC
  Filled 2014-11-12: qty 3

## 2014-11-12 MED ORDER — PREDNISOLONE ACETATE 1 % OP SUSP
1.0000 [drp] | Freq: Two times a day (BID) | OPHTHALMIC | Status: DC
  Filled 2014-11-12: qty 1

## 2014-11-12 MED ORDER — LORATADINE 10 MG PO TABS
10.0000 mg | ORAL_TABLET | Freq: Every day | ORAL | Status: DC
  Administered 2014-11-13 – 2014-11-14 (×2): 10 mg via ORAL
  Filled 2014-11-12 (×2): qty 1

## 2014-11-12 MED ORDER — SODIUM CHLORIDE 0.9 % IV SOLN
INTRAVENOUS | Status: DC
  Administered 2014-11-12: 17:00:00 via INTRAVENOUS

## 2014-11-12 MED ORDER — SODIUM CHLORIDE 0.9 % IV SOLN
INTRAVENOUS | Status: DC
  Administered 2014-11-12: 21:00:00 via INTRAVENOUS

## 2014-11-12 MED ORDER — NITROGLYCERIN 0.4 MG SL SUBL: 0.4000 mg | SUBLINGUAL_TABLET | SUBLINGUAL | Status: DC | PRN

## 2014-11-12 MED ORDER — HEPARIN SODIUM (PORCINE) 5000 UNIT/ML IJ SOLN
5000.0000 [IU] | Freq: Three times a day (TID) | INTRAMUSCULAR | Status: DC
  Administered 2014-11-12: 5000 [IU] via SUBCUTANEOUS
  Filled 2014-11-12: qty 1

## 2014-11-12 MED ORDER — ACETAMINOPHEN 325 MG PO TABS
650.0000 mg | ORAL_TABLET | ORAL | Status: DC | PRN
  Administered 2014-11-13: 650 mg via ORAL
  Filled 2014-11-12: qty 2

## 2014-11-12 MED ORDER — TRAMADOL HCL 50 MG PO TABS: 50.0000 mg | ORAL_TABLET | Freq: Four times a day (QID) | ORAL | Status: DC | PRN

## 2014-11-12 NOTE — ED Notes (Signed)
Pt reports dizziness since Thursday. Seen by FMD today and sent here for further evaluation of bradycardia. Denies CP or SOB at present.

## 2014-11-12 NOTE — Progress Notes (Signed)
eLink Physician-Brief Progress Note Patient Name: [name] DOB: Sep 14, 1929 MRN: [id number]   Date of Service  11/12/2014  HPI/Events of Note  New intervention: CHB, some BB on board, eating dinner speaking, normal BP, symptomatic for now  eICU Interventions  Low threshold to have temp wire,  Continued tele No further recs Normalize crt, follow lytes closely     Intervention Category Major Interventions: Arrhythmia - evaluation and management Evaluation Type: New Patient Evaluation  Raylene Miyamoto. 11/12/2014, 7:53 PM

## 2014-11-12 NOTE — H&P (Addendum)
[name] is an 81 y.o. female.   Primary Cardiologist: Dr. Wynonia Lawman PMD: Dr. Drema Dallas Chief Complaint: Dizziness HPI: 81 y/o woman with HTN and hypercholesterolemia.  She has had several orthopedic surgeries in the past.  She reports some left leg pain over the past few weeks. She notes that since Thursday, she has had some dizziness. She has not actually passed out. She has been using her walker to get around just so that she would decrease her risk of falling. She does not typically walk with a walker. She has been eating and drinking normally. She did not think her primary care doctor was working on Fridays. She waited until this morning and saw Dr. Drema Dallas. At Dr. Drema Dallas office, apparently the patient's heart rate was in the 30s and she was sent to the emergency room.  While in the emergency room, her heart rate has been in the 30s to 40s range. She does have complete heart block. She has had a stable blood pressure. While lying on the stretcher, she denies any lightheadedness or syncope. She denies any chest discomfort or shortness of breath. She last took her atenolol last night.  Past Medical History  Diagnosis Date  . Hypertension   . Osteoporosis   . Arthritis   . Breast cancer     History reviewed. No pertinent past surgical history.  History reviewed. No pertinent family history. Social History:  reports that she has never smoked. She does not have any smokeless tobacco history on file. She reports that she drinks alcohol. She reports that she does not use illicit drugs.  Allergies:  Allergies  Allergen Reactions  . Molds & Smuts Hives     (Not in a hospital admission)  Results for orders placed or performed during the hospital encounter of 11/12/14 (from the past 48 hour(s))  CBC     Status: Abnormal   Collection Time: 11/12/14  1:59 PM  Result Value Ref Range   WBC 8.4 4.0 - 10.5 K/uL   RBC 3.47 (L) 3.87 - 5.11 MIL/uL   Hemoglobin 11.1 (L) 12.0 - 15.0 g/dL   HCT  33.5 (L) 36.0 - 46.0 %   MCV 96.5 78.0 - 100.0 fL   MCH 32.0 26.0 - 34.0 pg   MCHC 33.1 30.0 - 36.0 g/dL   RDW 12.4 11.5 - 15.5 %   Platelets 246 150 - 400 K/uL  Basic metabolic panel     Status: Abnormal   Collection Time: 11/12/14  1:59 PM  Result Value Ref Range   Sodium 140 135 - 145 mmol/L   Potassium 4.2 3.5 - 5.1 mmol/L   Chloride 106 96 - 112 mmol/L   CO2 25 19 - 32 mmol/L   Glucose, Bld 115 (H) 70 - 99 mg/dL   BUN 31 (H) 6 - 23 mg/dL   Creatinine, Ser 1.68 (H) 0.50 - 1.10 mg/dL   Calcium 9.5 8.4 - 10.5 mg/dL   GFR calc non Af Amer 27 (L) >90 mL/min   GFR calc Af Amer 31 (L) >90 mL/min    Comment: (NOTE) The eGFR has been calculated using the CKD EPI equation. This calculation has not been validated in all clinical situations. eGFR's persistently <90 mL/min signify possible Chronic Kidney Disease.    Anion gap 9 5 - 15  I-stat troponin, ED (not at [facility])     Status: None   Collection Time: 11/12/14  2:20 PM  Result Value Ref Range   Troponin i, poc 0.00 0.00 -  0.08 ng/mL   Comment 3            Comment: Due to the release kinetics of cTnI, a negative result within the first hours of the onset of symptoms does not rule out myocardial infarction with certainty. If myocardial infarction is still suspected, repeat the test at appropriate intervals.   Urinalysis, Routine w reflex microscopic     Status: Abnormal   Collection Time: 11/12/14  3:16 PM  Result Value Ref Range   Color, Urine YELLOW YELLOW   APPearance CLEAR CLEAR   Specific Gravity, Urine 1.015 1.005 - 1.030   pH 6.0 5.0 - 8.0   Glucose, UA NEGATIVE NEGATIVE mg/dL   Hgb urine dipstick NEGATIVE NEGATIVE   Bilirubin Urine NEGATIVE NEGATIVE   Ketones, ur NEGATIVE NEGATIVE mg/dL   Protein, ur NEGATIVE NEGATIVE mg/dL   Urobilinogen, UA 0.2 0.0 - 1.0 mg/dL   Nitrite NEGATIVE NEGATIVE   Leukocytes, UA SMALL (A) NEGATIVE  Urine microscopic-add on     Status: Abnormal   Collection Time: 11/12/14  3:16 PM    Result Value Ref Range   Squamous Epithelial / LPF FEW (A) RARE   WBC, UA 3-6 <3 WBC/hpf   Bacteria, UA FEW (A) RARE   No results found.  ROS: Dizziness, joint pains, no syncope. No chest pains. No shortness of breath. All other systems negative  OBJECTIVE:   Vitals:   Filed Vitals:   11/12/14 1546 11/12/14 1600 11/12/14 1615 11/12/14 1630  BP: 135/91 128/54 127/60 136/90  Pulse: 36 75 53 144  Temp:      TempSrc:      Resp: $Remo'14 14 18 12  'GKhtJ$ Height:      Weight:      SpO2: 99% 100% 94% 100%   I&O's:  No intake or output data in the 24 hours ending 11/12/14 1649 TELEMETRY: Reviewed telemetry pt in normal sinus rhythm with complete heart block, junctional escape rhythm:     PHYSICAL EXAM General: Well developed, well nourished, in no acute distress Head:   Normal cephalic and atramatic  Lungs:  Clear bilaterally to auscultation. Heart:   HRRR S1 S2  No JVD.   Abdomen: abdomen soft and non-tender Msk:  Back normal,  Normal strength and tone for age. Extremities:   No edema.  2+ left dorsalis pedis pulse Neuro: Alert and oriented. Psych:  Normal affect, responds appropriately  LABS: Basic Metabolic Panel:  Recent Labs  11/12/14 1359  NA 140  K 4.2  CL 106  CO2 25  GLUCOSE 115*  BUN 31*  CREATININE 1.68*  CALCIUM 9.5   Liver Function Tests: No results for input(s): AST, ALT, ALKPHOS, BILITOT, PROT, ALBUMIN in the last 72 hours. No results for input(s): LIPASE, AMYLASE in the last 72 hours. CBC:  Recent Labs  11/12/14 1359  WBC 8.4  HGB 11.1*  HCT 33.5*  MCV 96.5  PLT 246   Cardiac Enzymes: No results for input(s): CKTOTAL, CKMB, CKMBINDEX, TROPONINI in the last 72 hours. BNP: Invalid input(s): POCBNP D-Dimer: No results for input(s): DDIMER in the last 72 hours. Hemoglobin A1C: No results for input(s): HGBA1C in the last 72 hours. Fasting Lipid Panel: No results for input(s): CHOL, HDL, LDLCALC, TRIG, CHOLHDL, LDLDIRECT in the last 72  hours. Thyroid Function Tests: No results for input(s): TSH, T4TOTAL, T3FREE, THYROIDAB in the last 72 hours.  Invalid input(s): FREET3 Anemia Panel: No results for input(s): VITAMINB12, FOLATE, FERRITIN, TIBC, IRON, RETICCTPCT in the last 72 hours. Coag  Panel:   Lab Results  Component Value Date   INR 0.98 08/14/2009   INR 1.0 09/28/2008   INR 3.7* 11/06/2007       Assessment/Plan 1) complete heart block: She apparently takes atenolol 12.5 mg daily. I doubt such a low dose would be responsible for her complete heart block. I think it is likely that she will need a permanent pacemaker. Will inform electrophysiology service. We'll keep the patient nothing by mouth past midnight in the event that this can be done tomorrow. I do not think she requires a temporary pacemaker at this time. Her blood pressures have been stable. She has not had any long pauses. She is asymptomatic as long as she is lying in the stretcher. Would keep the patient on bed rest until her pacemaker is placed.  D/w Dr. Lovena Le. Plan for pacer in the next 1-2 days depending on her rhythm; pacer may be delayed by allowing time for atenolol to wash out.  2) hypertension: We'll hold Her antihypertensives at this point. May need to add back the ARB if she becomes hypertensive.   3)  hyperlipidemia: Continue simvastatin.  4) Renal insufficiency: Unclear whether this is acute  or chronic. Old labs are not available. Her primary care doctor is not on Epic. This could be worsened by her complete heart block and decreased perfusion.   Kehinde Bowdish S. 11/12/2014, 4:49 PM

## 2014-11-12 NOTE — ED Provider Notes (Signed)
CSN: [id number]     Arrival date & time 11/12/14  1341 History   First MD Initiated Contact with Patient 11/12/14 1427     Chief Complaint  Patient presents with  . Dizziness     (Consider location/radiation/quality/duration/timing/severity/associated sxs/prior Treatment) HPI  [name] is a 81 y.o. female who presents for evaluation of dizziness that started several days ago.  There is no change going from supine to standing.  Dizziness is persistent and ongoing.  She went to see her doctor today for checkup and was sent here to be evaluated for bradycardia.  She denies chest pain, shortness of breath, nausea, vomiting, diarrhea, dysuria, urinary frequency, headache or back pain.  He is taking her usual medications, as prescribed.  There are no other known modifying factors.    Past Medical History  Diagnosis Date  . Hypertension   . Osteoporosis   . Arthritis   . Breast cancer    History reviewed. No pertinent past surgical history. History reviewed. No pertinent family history. History  Substance Use Topics  . Smoking status: Never Smoker   . Smokeless tobacco: Not on file  . Alcohol Use: Yes     Comment: rarely   OB History    No data available     Review of Systems  All other systems reviewed and are negative.     Allergies  Molds & smuts  Home Medications   Prior to Admission medications   Medication Sig Start Date End Date Taking? Authorizing Provider  atenolol (TENORMIN) 50 MG tablet Take 25 mg by mouth every evening. 08/09/14  Yes Historical Provider, MD  Bromfenac Sodium 0.07 % SOLN Place 1 drop into both eyes daily.   Yes Historical Provider, MD  Calcium Carb-Cholecalciferol (CALCIUM 600 + D PO) Take 1 tablet by mouth daily.   Yes Historical Provider, MD  Fish Oil-Cholecalciferol (FISH OIL + D3) 1000-1000 MG-UNIT CAPS Take 1 capsule by mouth daily.   Yes Historical Provider, MD  Glucosamine-Chondroitin 250-200 MG TABS Take 1 tablet by mouth daily.    Yes Historical Provider, MD  loratadine (CLARITIN) 10 MG tablet Take 10 mg by mouth daily.   Yes Historical Provider, MD  losartan (COZAAR) 50 MG tablet Take 50 mg by mouth every morning. 10/03/14  Yes Historical Provider, MD  prednisoLONE acetate (PRED FORTE) 1 % ophthalmic suspension Place 1 drop into both eyes 2 (two) times daily.   Yes Historical Provider, MD  simvastatin (ZOCOR) 20 MG tablet Take 10 mg by mouth daily. 09/10/14  Yes Historical Provider, MD  traMADol (ULTRAM) 50 MG tablet Take 50 mg by mouth every 6 (six) hours as needed.   Yes Historical Provider, MD   BP 136/90 mmHg  Pulse 144  Temp(Src) 97.7 F (36.5 C) (Oral)  Resp 12  Ht 5\' 2"  (1.575 m)  Wt 140 lb (63.504 kg)  BMI 25.60 kg/m2  SpO2 100% Physical Exam  Constitutional: She is oriented to person, place, and time. She appears well-developed.  Elderly, frail  HENT:  Head: Normocephalic and atraumatic.  Right Ear: External ear normal.  Left Ear: External ear normal.  Eyes: Conjunctivae and EOM are normal. Pupils are equal, round, and reactive to light.  Neck: Normal range of motion and phonation normal. Neck supple.  Cardiovascular: Regular rhythm and normal heart sounds.   Bradycardia  Pulmonary/Chest: Effort normal and breath sounds normal. She exhibits no bony tenderness.  Abdominal: Soft. There is no tenderness.  Musculoskeletal: Normal range of motion.  Neurological: She is alert and oriented to person, place, and time. No cranial nerve deficit or sensory deficit. She exhibits normal muscle tone. Coordination normal.  Skin: Skin is warm, dry and intact.  Psychiatric: She has a normal mood and affect. Her behavior is normal. Judgment and thought content normal.  Nursing note and vitals reviewed.   ED Course  Procedures (including critical care time)  Medications  0.9 %  sodium chloride infusion (not administered)    Patient Vitals for the past 24 hrs:  BP Temp Temp src Pulse Resp SpO2 Height Weight   11/12/14 1630 136/90 mmHg - - (!) 144 12 100 % - -  11/12/14 1615 127/60 mmHg - - (!) 53 18 94 % - -  11/12/14 1600 (!) 128/54 mmHg - - 75 14 100 % - -  11/12/14 1546 135/91 mmHg - - (!) 36 14 99 % - -  11/12/14 1500 (!) 109/45 mmHg - - (!) 36 16 100 % - -  11/12/14 1445 123/83 mmHg - - (!) 36 15 98 % - -  11/12/14 1430 136/79 mmHg - - (!) 37 18 100 % - -  11/12/14 1349 153/91 mmHg 97.7 F (36.5 C) Oral (!) 38 16 98 % 5\' 2"  (1.575 m) 140 lb (63.504 kg)    4:21 PM Reevaluation with update and discussion. After initial assessment and treatment, an updated evaluation reveals patient remains alert, calm, cooperative and asymptomatic.  Heart rate is 36.  Cardiac monitor continues to show intermittent bradycardia with periods of AV dissociation and sinus capture.  This is an unstable cardiac rhythm, patient informed of the findings, questions answered. [name] ]  4:30 PM-Consult complete with [facility]. Patient case explained and discussed. She agrees to admit patient for further evaluation and treatment. Call ended at [place] Reviewed  CBC - Abnormal; Notable for the following:    RBC 3.47 (*)    Hemoglobin 11.1 (*)    HCT 33.5 (*)    All other components within normal limits  BASIC METABOLIC PANEL - Abnormal; Notable for the following:    Glucose, Bld 115 (*)    BUN 31 (*)    Creatinine, Ser 1.68 (*)    GFR calc non Af Amer 27 (*)    GFR calc Af Amer 31 (*)    All other components within normal limits  URINALYSIS, ROUTINE W REFLEX MICROSCOPIC - Abnormal; Notable for the following:    Leukocytes, UA SMALL (*)    All other components within normal limits  URINE MICROSCOPIC-ADD ON - Abnormal; Notable for the following:    Squamous Epithelial / LPF FEW (*)    Bacteria, UA FEW (*)    All other components within normal limits  URINE CULTURE  I-STAT TROPOININ, ED   BUN  Date Value Ref Range Status  11/12/2014 31* 6 - 23 mg/dL Final  11/10/2009 32* 6 - 23 mg/dL  Final  08/22/2009 19 6 - 23 mg/dL Final  08/21/2009 23 6 - 23 mg/dL Final   CREATININE, SER  Date Value Ref Range Status  11/12/2014 1.68* 0.50 - 1.10 mg/dL Final  11/10/2009 1.48* 0.4 - 1.2 mg/dL Final  08/22/2009 1.32* 0.4 - 1.2 mg/dL Final  08/21/2009 1.42* 0.4 - 1.2 mg/dL Final       Imaging Review No results found.   EKG Interpretation   Date/Time:  Monday November 12 2014 13:44:58 EST Ventricular Rate:  39 PR Interval:    QRS Duration: 84 QT  Interval:  514 QTC Calculation: 413 R Axis:   85 Text Interpretation:  Marked sinus bradycardia with A-V dissociation and  Junctional bradycardia with Sinus/atrial capture Abnormal ECG No old  tracing to compare Confirmed by [facility]  MD, Jael Waldorf [phone]) on 11/12/2014  4:10:36 PM      MDM   Final diagnoses:  Complete heart block  Renal insufficiency    Dizziness with complete heart block.  She is on beta-blockade as the likely source.  No evidence for seizures bacterial infection or metabolic instability.  She has mild elevation of creatinine, and borderline abnormal urinre analysis.  IV fluids initiated.  Urine culture ordered.  Nursing Notes Reviewed/ Care Coordinated, and agree without changes. Applicable Imaging Reviewed.  Interpretation of Laboratory Data incorporated into ED treatment  Plan: Admit  Richarda Blade, MD 11/12/14 [phone]

## 2014-11-13 ENCOUNTER — Encounter (HOSPITAL_COMMUNITY)
Admission: EM | Disposition: A | Discharge status: Skilled Nursing Facility | Payer: Self-pay | Source: Home / Self Care | Attending: Cardiology

## 2014-11-13 ENCOUNTER — Encounter (HOSPITAL_COMMUNITY): Payer: Self-pay | Admitting: *Deleted

## 2014-11-13 ENCOUNTER — Inpatient Hospital Stay (HOSPITAL_COMMUNITY): Payer: Medicare Other

## 2014-11-13 DIAGNOSIS — I442 Atrioventricular block, complete: Secondary | ICD-10-CM

## 2014-11-13 HISTORY — PX: PACEMAKER INSERTION: SHX728

## 2014-11-13 HISTORY — PX: PERMANENT PACEMAKER INSERTION: SHX5480

## 2014-11-13 LAB — BASIC METABOLIC PANEL
Anion gap: 6 (ref 5–15)
BUN: 26 mg/dL — ABNORMAL HIGH (ref 6–23)
CHLORIDE: 109 mmol/L (ref 96–112)
CO2: 25 mmol/L (ref 19–32)
CREATININE: 1.61 mg/dL — AB (ref 0.50–1.10)
Calcium: 8.9 mg/dL (ref 8.4–10.5)
GFR, EST AFRICAN AMERICAN: 33 mL/min — AB (ref >90)
GFR, EST NON AFRICAN AMERICAN: 28 mL/min — AB (ref >90)
GLUCOSE: 96 mg/dL (ref 70–99)
POTASSIUM: 4.3 mmol/L (ref 3.5–5.1)
Sodium: 140 mmol/L (ref 135–145)

## 2014-11-13 LAB — URINE CULTURE
Colony Count: >=100000
SPECIAL REQUESTS: NORMAL

## 2014-11-13 SURGERY — PERMANENT PACEMAKER INSERTION

## 2014-11-13 MED ORDER — LIDOCAINE HCL (PF) 1 % IJ SOLN
INTRAMUSCULAR | Status: AC
  Filled 2014-11-13: qty 60

## 2014-11-13 MED ORDER — FENTANYL CITRATE 0.05 MG/ML IJ SOLN
INTRAMUSCULAR | Status: AC
  Filled 2014-11-13: qty 2

## 2014-11-13 MED ORDER — MIDAZOLAM HCL 5 MG/5ML IJ SOLN
INTRAMUSCULAR | Status: AC
  Filled 2014-11-13: qty 5

## 2014-11-13 MED ORDER — SODIUM CHLORIDE 0.9 % IR SOLN
80.0000 mg | Status: DC
  Filled 2014-11-13: qty 2

## 2014-11-13 MED ORDER — SODIUM CHLORIDE 0.9 % IV SOLN: 250.0000 mL | INTRAVENOUS | Status: DC | PRN

## 2014-11-13 MED ORDER — HYDROCODONE-ACETAMINOPHEN 5-325 MG PO TABS
1.0000 | ORAL_TABLET | ORAL | Status: DC | PRN
  Administered 2014-11-13 – 2014-11-14 (×3): 1 via ORAL
  Filled 2014-11-13 (×3): qty 1

## 2014-11-13 MED ORDER — SODIUM CHLORIDE 0.9 % IJ SOLN
3.0000 mL | INTRAMUSCULAR | Status: DC | PRN
  Administered 2014-11-14: 3 mL via INTRAVENOUS
  Filled 2014-11-13: qty 3

## 2014-11-13 MED ORDER — CHLORHEXIDINE GLUCONATE 4 % EX LIQD
60.0000 mL | Freq: Once | CUTANEOUS | Status: AC
  Filled 2014-11-13: qty 60

## 2014-11-13 MED ORDER — ACETAMINOPHEN 325 MG PO TABS: 325.0000 mg | ORAL_TABLET | ORAL | Status: DC | PRN

## 2014-11-13 MED ORDER — CEFAZOLIN SODIUM-DEXTROSE 2-3 GM-% IV SOLR
2.0000 g | INTRAVENOUS | Status: DC
  Filled 2014-11-13: qty 50

## 2014-11-13 MED ORDER — SODIUM CHLORIDE 0.9 % IV SOLN
INTRAVENOUS | Status: DC
Start: 2014-11-13 — End: 2014-11-13
  Administered 2014-11-13: 10:00:00 via INTRAVENOUS

## 2014-11-13 MED ORDER — SODIUM CHLORIDE 0.9 % IJ SOLN
3.0000 mL | Freq: Two times a day (BID) | INTRAMUSCULAR | Status: DC
  Administered 2014-11-14: 3 mL via INTRAVENOUS

## 2014-11-13 MED ORDER — ONDANSETRON HCL 4 MG/2ML IJ SOLN: 4.0000 mg | Freq: Four times a day (QID) | INTRAMUSCULAR | Status: DC | PRN

## 2014-11-13 MED ORDER — CEFAZOLIN SODIUM 1-5 GM-% IV SOLN
1.0000 g | Freq: Two times a day (BID) | INTRAVENOUS | Status: AC
  Administered 2014-11-13 – 2014-11-14 (×2): 1 g via INTRAVENOUS
  Filled 2014-11-13 (×2): qty 50

## 2014-11-13 MED ORDER — CHLORHEXIDINE GLUCONATE 4 % EX LIQD
60.0000 mL | Freq: Once | CUTANEOUS | Status: AC
  Administered 2014-11-13: 4 via TOPICAL

## 2014-11-13 NOTE — Care Management Note (Addendum)
    Page 1 of 1   11/14/2014     3:15:11 PM CARE MANAGEMENT NOTE 11/14/2014  Patient:  [name], [name]   Account Number:  [id number]  Date Initiated:  11/13/2014  Documentation initiated by:  Elissa Hefty  Subjective/Objective Assessment:   adm w heart block     Action/Plan:   lives at [facility]  pt lives at [facility]. she can get nse to ck on her from community but not act w hhc agency at present.   Anticipated DC Date:  11/14/2014   Anticipated DC Plan:  SKILLED NURSING FACILITY  In-house referral  Clinical Social Worker      DC Planning Services  CM consult      Choice offered to / List presented to:             Status of service:  Completed, signed off Medicare Important Message given?  NA - LOS <3 / Initial given by admissions (If response is "NO", the following Medicare IM given date fields will be blank) Date Medicare IM given:   Medicare IM given by:   Date Additional Medicare IM given:   Additional Medicare IM given by:    Discharge Disposition:  [place]  Per UR Regulation:  Reviewed for med. necessity/level of care/duration of stay  If discussed at [place] of Stay Meetings, dates discussed:    Comments:  11/14/14 Ellan Lambert, RN, BSN [phone] Pt is from [place], but she desires to go to SNF level.  Pt will need FL2, PT/OT consults for insurance. Notified CSW of dc today and need for SNF.  Pt states contact at [facility] is Bernice.  2/2 1143 debbie dowell rn,bsn spoke w pt. she is not act w hhc at present. she is in [place] community where there is indep living to snf. she can call in and have nse from community ck on her but not act w outside hhc agency at present.

## 2014-11-13 NOTE — Progress Notes (Signed)
ELECTROPHYSIOLOGY CONSULT NOTE    Patient ID: [name] MRN: [id number], DOB/AGE: [birth year] 81 y.o.  Admit date: 11/12/2014 Date of Consult: 11/13/2014  Primary Physician: Drema Dallas Primary Cardiologist: Wynonia Lawman  Reason for Consultation: heart block  HPI:  [name] is a 81 y.o. female with a past medical history significant for hypertension and arthritis.  She has had progressive exercise intolerance and dizziness over the last few days.  She went to her PCP's office and her HR was in the 30's.  She was sent to [facility] for further evaluation.  She has been on Atenolol at home with the last dose being the evening of 11/11/14.  Telemetry has demonstrated persistent complete heart block.  Echo is ordered and pending.    She currently denies chest pain, shortness of breath, LE edema, recent fevers, chills, nausea or vomiting.  She does have persistent dizziness when standing.  ROS is otherwise negative.   EP has been asked to evaluate for treatment options.   Past Medical History  Diagnosis Date  . Hypertension   . Osteoporosis   . Arthritis   . Breast cancer      Surgical History: History reviewed. No pertinent past surgical history.   Prescriptions prior to admission  Medication Sig Dispense Refill Last Dose  . atenolol (TENORMIN) 50 MG tablet Take 25 mg by mouth every evening.   11/11/2014 at 1800  . Bromfenac Sodium 0.07 % SOLN Place 1 drop into both eyes daily.   11/12/2014 at Unknown time  . Calcium Carb-Cholecalciferol (CALCIUM 600 + D PO) Take 1 tablet by mouth daily.   11/12/2014 at Unknown time  . Fish Oil-Cholecalciferol (FISH OIL + D3) 1000-1000 MG-UNIT CAPS Take 1 capsule by mouth daily.   11/12/2014 at Unknown time  . Glucosamine-Chondroitin 250-200 MG TABS Take 1 tablet by mouth daily.   11/12/2014 at Unknown time  . loratadine (CLARITIN) 10 MG tablet Take 10 mg by mouth daily.   11/12/2014 at Unknown time  . losartan (COZAAR) 50 MG tablet Take 50 mg by mouth every  morning.   11/12/2014 at Unknown time  . prednisoLONE acetate (PRED FORTE) 1 % ophthalmic suspension Place 1 drop into both eyes 2 (two) times daily.   11/12/2014 at Unknown time  . simvastatin (ZOCOR) 20 MG tablet Take 10 mg by mouth daily.   11/12/2014 at Unknown time  . traMADol (ULTRAM) 50 MG tablet Take 50 mg by mouth every 6 (six) hours as needed.   Past Month at Unknown time    Inpatient Medications:  . ketorolac  1 drop Both Eyes QID  . loratadine  10 mg Oral Daily  . prednisoLONE acetate  1 drop Both Eyes BID  . simvastatin  10 mg Oral Daily    Allergies:  Allergies  Allergen Reactions  . Molds & Smuts Hives    History   Social History  . Marital Status: Divorced    Spouse Name: N/A    Number of Children: N/A  . Years of Education: N/A   Occupational History  . Not on file.   Social History Main Topics  . Smoking status: Never Smoker   . Smokeless tobacco: Not on file  . Alcohol Use: Yes     Comment: rarely  . Drug Use: No  . Sexual Activity: Not on file   Other Topics Concern  . Not on file   Social History Narrative     Family History  Problem Relation Age of Onset  .  Hypertension Mother   . Hyperlipidemia Mother   . Alcoholism Father      BP 122/46 mmHg  Pulse 40  Temp(Src) 99.8 F (37.7 C) (Oral)  Resp 21  Ht 5\' 2"  (1.575 m)  Wt 135 lb (61.236 kg)  BMI 24.69 kg/m2  SpO2 95%  Physical Exam: Well appearing elderly woman, NAD HEENT: Unremarkable,[place], AT Neck:  7 JVD, no thyromegally Back:  No CVA tenderness Lungs:  Clear with no wheezes or rhonchi, scattered basilar rales, no increased work of breathing. HEART:  Regular brady rhythm, no murmurs, no rubs, no clicks Abd:  soft, positive bowel sounds, no organomegally, no rebound, no guarding Ext:  2 plus pulses, no edema, no cyanosis, no clubbing Skin:  No rashes no nodules Neuro:  CN II through XII intact, motor grossly intact   Labs:   Lab Results  Component Value Date   WBC 8.4  11/12/2014   HGB 11.1* 11/12/2014   HCT 33.5* 11/12/2014   MCV 96.5 11/12/2014   PLT 246 11/12/2014     Recent Labs Lab 11/13/14 0225  NA 140  K 4.3  CL 109  CO2 25  BUN 26*  CREATININE 1.61*  CALCIUM 8.9  GLUCOSE 96     Radiology/Studies: No results found.  EKG: NSR with CHB  TELEMETRY: persistent complete heart block, ventricular rates 30-40's  A/P 1. Complete heart block 2. HTN Rec: The patient has now had nearly 36 hours of being off atenolol. She still has complete heart block. I have recommended that we plan to proceed with PPM if afternoon today she still had heart block. Note echo pending but I suspect she will have normal LV function. Her QRS is narrow. She may require additional blood pressure lowering medication.  Mikle Bosworth.D.

## 2014-11-13 NOTE — Progress Notes (Signed)
Echocardiogram 2D Echocardiogram has been performed.  [name] 11/13/2014, 10:17 AM

## 2014-11-13 NOTE — Progress Notes (Signed)
Subjective:  Blood pressure has been maintained overnight and continues in complete heart block.  Objective:  Vital Signs in the last 24 hours: BP 122/46 mmHg  Pulse 40  Temp(Src) 99.8 F (37.7 C) (Oral)  Resp 21  Ht 5\' 2"  (1.575 m)  Wt 61.236 kg (135 lb)  BMI 24.69 kg/m2  SpO2 95%  Physical Exam: Pleasant female in no acute distress Lungs:  Clear Cardiac:  Slow, Regular rhythm, normal S1 and S2, no S3 Abdomen:  Soft, nontender, no masses Extremities:  No edema present  Intake/Output from previous day: 02/01 0701 - 02/02 0700 In: 352.7 [P.O.:240; I.V.:112.7] Out: 250 [Urine:250]  Weight Filed Weights   11/12/14 1349 11/12/14 1853  Weight: 63.504 kg (140 lb) 61.236 kg (135 lb)    Lab Results: Basic Metabolic Panel:  Recent Labs  11/12/14 1359 11/13/14 0225  NA 140 140  K 4.2 4.3  CL 106 109  CO2 25 25  GLUCOSE 115* 96  BUN 31* 26*  CREATININE 1.68* 1.61*   CBC:  Recent Labs  11/12/14 1359  WBC 8.4  HGB 11.1*  HCT 33.5*  MCV 96.5  PLT 246   Telemetry: Complete heart block with narrow QRS complex  Assessment/Plan:  1.  Complete heart block likely due to degenerative conduction system disease 2.  Stage III chronic kidney disease 3.  Hypertension  Recommendations:  Appreciate Dr. Tanna Furry help.  Would prefer a Medtronic pacemaker.  We'll follow along with you.  Check echo later.      Kerry Hough  MD [facility] Cardiology  11/13/2014, 8:45 AM

## 2014-11-13 NOTE — Op Note (Signed)
SURGEON:  Thompson Grayer, MD     PREPROCEDURE DIAGNOSIS:  Symptomatic complete heart block    POSTPROCEDURE DIAGNOSIS:  Symptomatic complete heart block     PROCEDURES:   1. Pacemaker implantation.     INTRODUCTION: [name] is a 81 y.o. female  with a history of bradycardia who presents today for pacemaker implantation.  The patient reports progressive SOB and fatigue over the past few days.  She presented with complete heart block.  After adequate washout of all AV nodal drugs, her AV block persists.  No reversible causes have been identified.  The patient therefore presents today for pacemaker implantation.     DESCRIPTION OF PROCEDURE:  Informed written consent was obtained, and the patient was brought to the electrophysiology lab in a fasting state.  The patient received IV Versed sedation for the procedure today as outlined in the nursing report.  The patients left chest was prepped and draped in the usual sterile fashion by the EP lab staff. The skin overlying the left deltopectoral region was infiltrated with lidocaine for local analgesia.  A 4-cm incision was made over the left deltopectoral region.  A left subcutaneous pacemaker pocket was fashioned using a combination of sharp and blunt dissection. Electrocautery was required to assure hemostasis.    RA/RV Lead Placement: The left axillary vein was therefore cannulated.  Through the left axillary vein, a Medtronic model [id number] (serial number [id number]) right atrial lead and a Medtronic model 9935- 58 (serial number [id number]) right ventricular lead were advanced with fluoroscopic visualization into the right atrial appendage and right ventricular apex positions respectively.  Initial atrial lead P- waves measured 1.8 mV with impedance of 545 ohms and a threshold of 1.7 V at 0.5 msec.  Right ventricular lead R-waves measured 6 mV with an impedance of 524 ohms and a threshold of 0.3 V at 0.5 msec.  Both leads were secured to the  pectoralis fascia using #2-0 silk over the suture sleeves.  There was a very prominent "shelf" along the junction of the SVC and right atrium.  Though copious lead slack was placed for both atrial and ventricular leads, all of the lead slack would coalesce in the SVC.  Multiple attempts were made to avoid this without success.  It was felt that a very adequate amount of lead slack was used and that additional lead slack would not likely be beneficial.  Device Placement:  The leads were then connected to a Medtronic Adapta L model ADDRL 1 (serial number [id number]) pacemaker.  The pocket was irrigated with copious gentamicin solution.  The pacemaker was then placed into the pocket.  The pocket was then closed in 2 layers with 2.0 Vicryl suture for the subcutaneous and subcuticular layers.  Steri-  Strips and a sterile dressing were then applied. EBL<37ml. There were no early apparent complications.     CONCLUSIONS:   1. Successful implantation of a Medtronic Adapta L dual-chamber pacemaker for symptomatic complete heart block  2. No early apparent complications.       Permanent Pacemaker Indication: Documented non-reversible symptomatic bradycardia due to second degree and/or third degree atrioventricular block.     Thompson Grayer, MD 11/13/2014 5:19 PM

## 2014-11-13 NOTE — H&P (View-Only) (Signed)
ELECTROPHYSIOLOGY CONSULT NOTE    Patient ID: [name] MRN: [id number], DOB/AGE: Aug 21, 1929 81 y.o.  Admit date: 11/12/2014 Date of Consult: 11/13/2014  Primary Physician: Drema Dallas Primary Cardiologist: Wynonia Lawman  Reason for Consultation: heart block  HPI:  [name] is a 81 y.o. female with a past medical history significant for hypertension and arthritis.  She has had progressive exercise intolerance and dizziness over the last few days.  She went to her PCP's office and her HR was in the 30's.  She was sent to [facility] for further evaluation.  She has been on Atenolol at home with the last dose being the evening of 11/11/14.  Telemetry has demonstrated persistent complete heart block.  Echo is ordered and pending.    She currently denies chest pain, shortness of breath, LE edema, recent fevers, chills, nausea or vomiting.  She does have persistent dizziness when standing.  ROS is otherwise negative.   EP has been asked to evaluate for treatment options.   Past Medical History  Diagnosis Date  . Hypertension   . Osteoporosis   . Arthritis   . Breast cancer      Surgical History: History reviewed. No pertinent past surgical history.   Prescriptions prior to admission  Medication Sig Dispense Refill Last Dose  . atenolol (TENORMIN) 50 MG tablet Take 25 mg by mouth every evening.   11/11/2014 at 1800  . Bromfenac Sodium 0.07 % SOLN Place 1 drop into both eyes daily.   11/12/2014 at Unknown time  . Calcium Carb-Cholecalciferol (CALCIUM 600 + D PO) Take 1 tablet by mouth daily.   11/12/2014 at Unknown time  . Fish Oil-Cholecalciferol (FISH OIL + D3) 1000-1000 MG-UNIT CAPS Take 1 capsule by mouth daily.   11/12/2014 at Unknown time  . Glucosamine-Chondroitin 250-200 MG TABS Take 1 tablet by mouth daily.   11/12/2014 at Unknown time  . loratadine (CLARITIN) 10 MG tablet Take 10 mg by mouth daily.   11/12/2014 at Unknown time  . losartan (COZAAR) 50 MG tablet Take 50 mg by mouth every  morning.   11/12/2014 at Unknown time  . prednisoLONE acetate (PRED FORTE) 1 % ophthalmic suspension Place 1 drop into both eyes 2 (two) times daily.   11/12/2014 at Unknown time  . simvastatin (ZOCOR) 20 MG tablet Take 10 mg by mouth daily.   11/12/2014 at Unknown time  . traMADol (ULTRAM) 50 MG tablet Take 50 mg by mouth every 6 (six) hours as needed.   Past Month at Unknown time    Inpatient Medications:  . ketorolac  1 drop Both Eyes QID  . loratadine  10 mg Oral Daily  . prednisoLONE acetate  1 drop Both Eyes BID  . simvastatin  10 mg Oral Daily    Allergies:  Allergies  Allergen Reactions  . Molds & Smuts Hives    History   Social History  . Marital Status: Divorced    Spouse Name: N/A    Number of Children: N/A  . Years of Education: N/A   Occupational History  . Not on file.   Social History Main Topics  . Smoking status: Never Smoker   . Smokeless tobacco: Not on file  . Alcohol Use: Yes     Comment: rarely  . Drug Use: No  . Sexual Activity: Not on file   Other Topics Concern  . Not on file   Social History Narrative     Family History  Problem Relation Age of Onset  .  Hypertension Mother   . Hyperlipidemia Mother   . Alcoholism Father      BP 122/46 mmHg  Pulse 40  Temp(Src) 99.8 F (37.7 C) (Oral)  Resp 21  Ht 5\' 2"  (1.575 m)  Wt 135 lb (61.236 kg)  BMI 24.69 kg/m2  SpO2 95%  Physical Exam: Well appearing elderly woman, NAD HEENT: Unremarkable,[place], AT Neck:  7 JVD, no thyromegally Back:  No CVA tenderness Lungs:  Clear with no wheezes or rhonchi, scattered basilar rales, no increased work of breathing. HEART:  Regular brady rhythm, no murmurs, no rubs, no clicks Abd:  soft, positive bowel sounds, no organomegally, no rebound, no guarding Ext:  2 plus pulses, no edema, no cyanosis, no clubbing Skin:  No rashes no nodules Neuro:  CN II through XII intact, motor grossly intact   Labs:   Lab Results  Component Value Date   WBC 8.4  11/12/2014   HGB 11.1* 11/12/2014   HCT 33.5* 11/12/2014   MCV 96.5 11/12/2014   PLT 246 11/12/2014     Recent Labs Lab 11/13/14 0225  NA 140  K 4.3  CL 109  CO2 25  BUN 26*  CREATININE 1.61*  CALCIUM 8.9  GLUCOSE 96     Radiology/Studies: No results found.  EKG: NSR with CHB  TELEMETRY: persistent complete heart block, ventricular rates 30-40's  A/P 1. Complete heart block 2. HTN Rec: The patient has now had nearly 36 hours of being off atenolol. She still has complete heart block. I have recommended that we plan to proceed with PPM if afternoon today she still had heart block. Note echo pending but I suspect she will have normal LV function. Her QRS is narrow. She may require additional blood pressure lowering medication.  Mikle Bosworth.D.

## 2014-11-13 NOTE — Interval H&P Note (Signed)
History and Physical Interval Note:  11/13/2014 11:52 AM  The patient has complete heart block without improvement s/p washout of atenolol.   I would therefore recommend pacemaker implantation at this time.  Risks, benefits, alternatives to pacemaker implantation were discussed in detail with the patient today. The patient understands that the risks include but are not limited to bleeding, infection, pneumothorax, perforation, tamponade, vascular damage, renal failure, MI, stroke, death,  and lead dislodgement and wishes to proceed.     [name]  has presented today for surgery, with the diagnosis of hb  The various methods of treatment have been discussed with the patient and family. After consideration of risks, benefits and other options for treatment, the patient has consented to  Procedure(s): PERMANENT PACEMAKER INSERTION (N/A) as a surgical intervention .  The patient's history has been reviewed, patient examined, no change in status, stable for surgery.  I have reviewed the patient's chart and labs.  Questions were answered to the patient's satisfaction.     Thompson Grayer

## 2014-11-14 ENCOUNTER — Inpatient Hospital Stay (HOSPITAL_COMMUNITY): Payer: Medicare Other

## 2014-11-14 ENCOUNTER — Telehealth: Payer: Self-pay | Admitting: Internal Medicine

## 2014-11-14 ENCOUNTER — Encounter (HOSPITAL_COMMUNITY): Payer: Self-pay | Admitting: Internal Medicine

## 2014-11-14 DIAGNOSIS — Z950 Presence of cardiac pacemaker: Secondary | ICD-10-CM | POA: Diagnosis present

## 2014-11-14 LAB — BASIC METABOLIC PANEL
ANION GAP: 6 (ref 5–15)
BUN: 21 mg/dL (ref 6–23)
CALCIUM: 8.7 mg/dL (ref 8.4–10.5)
CHLORIDE: 107 mmol/L (ref 96–112)
CO2: 27 mmol/L (ref 19–32)
Creatinine, Ser: 1.61 mg/dL — ABNORMAL HIGH (ref 0.50–1.10)
GFR calc Af Amer: 33 mL/min — ABNORMAL LOW (ref >90)
GFR, EST NON AFRICAN AMERICAN: 28 mL/min — AB (ref >90)
Glucose, Bld: 96 mg/dL (ref 70–99)
POTASSIUM: 4.6 mmol/L (ref 3.5–5.1)
Sodium: 140 mmol/L (ref 135–145)

## 2014-11-14 LAB — HEMOGLOBIN A1C
Hgb A1c MFr Bld: 5.7 % — ABNORMAL HIGH (ref 4.8–5.6)
MEAN PLASMA GLUCOSE: 117 mg/dL

## 2014-11-14 NOTE — Progress Notes (Signed)
SUBJECTIVE: The patient is doing well today.  At this time, she denies chest pain, shortness of breath, or any new concerns.  S/p PPM implant 11/13/14 for heart block.     CURRENT MEDICATIONS: .  ceFAZolin (ANCEF) IV  1 g Intravenous Q12H  . ketorolac  1 drop Both Eyes QID  . loratadine  10 mg Oral Daily  . prednisoLONE acetate  1 drop Both Eyes BID  . simvastatin  10 mg Oral Daily  . sodium chloride  3 mL Intravenous Q12H      OBJECTIVE: Physical Exam: Filed Vitals:   11/13/14 1800 11/13/14 1835 11/14/14 0100 11/14/14 0440  BP: 149/54 123/52 134/87 134/64  Pulse: 60 60 68 66  Temp:   98.3 F (36.8 C) 98.4 F (36.9 C)  TempSrc:   Oral Oral  Resp:   16 18  Height:      Weight:    136 lb 14.5 oz (62.1 kg)  SpO2:   97% 96%    Intake/Output Summary (Last 24 hours) at 11/14/14 0641 Last data filed at 11/14/14 0435  Gross per 24 hour  Intake 396.66 ml  Output    850 ml  Net -453.34 ml    Telemetry reveals sinus rhythm with ventricular pacing  GEN- The patient is well appearing, alert and oriented x 3 today.   Head- normocephalic, atraumatic Eyes-  Sclera clear, conjunctiva pink Ears- hearing intact Oropharynx- clear Neck- supple, no JVP Lymph- no cervical lymphadenopathy Lungs- Clear to ausculation bilaterally, normal work of breathing Heart- Regular rate and rhythm, no murmurs, rubs or gallops, PMI not laterally displaced GI- soft, NT, ND, + BS Extremities- no clubbing, cyanosis, or edema Skin- no rash or lesion, left chest pacemaker pocket without hematoma Psych- euthymic mood, full affect Neuro- strength and sensation are intact  LABS: Basic Metabolic Panel:  Recent Labs  11/12/14 1359 11/13/14 0225  NA 140 140  K 4.2 4.3  CL 106 109  CO2 25 25  GLUCOSE 115* 96  BUN 31* 26*  CREATININE 1.68* 1.61*  CALCIUM 9.5 8.9   CBC:  Recent Labs  11/12/14 1359  WBC 8.4  HGB 11.1*  HCT 33.5*  MCV 96.5  PLT 246    Hemoglobin A1C:  Recent Labs  11/12/14 2007  HGBA1C 5.7*   Thyroid Function Tests:  Recent Labs  11/12/14 2007  TSH 2.354    RADIOLOGY: Dg Chest Port 1 View 11/13/2014   CLINICAL DATA:  Heart block  EXAM: PORTABLE CHEST - 1 VIEW  COMPARISON:  07/03/2014  FINDINGS: Cardiomegaly which is similar to prior given differences in technique.  There is diffuse interstitial coarsening which has increased from prior baseline. No effusion or pneumothorax. Aortic and hilar contours are stable.  Status post left axillary dissection. Notable glenohumeral osteoarthritis with multiple intra-articular bodies on the right.  IMPRESSION: Cardiomegaly and pulmonary venous congestion.   Electronically Signed   By: Jorje Guild M.D.   On: 11/13/2014 07:48    ASSESSMENT AND PLAN:  Active Problems:   Complete heart block   Hypertension   Hypercholesterolemia   1.  Complete heart block Doing well s/p PPM implantation Device interrogated and found to be functioning normally - see paper chart CXR demonstrated no ptx and stable leads Wound care, restrictions reviewed with patient.  Wound check scheduled for 11/22/14 at 10:30AM at [facility].  I would like to see her back once for 3 month follow-up.  At that time I will return her pacemaker  care in its entirety back to Dr Wynonia Lawman.  Ok to discharge home from EP standpoint.  Please call with questions.

## 2014-11-14 NOTE — Discharge Summary (Cosign Needed)
Physician Discharge Summary  Patient ID: [name] MRN: [id number] DOB/AGE: 13-May-1929 81 y.o.  Admit date: 11/12/2014 Discharge date: 11/14/2014  Primary Physician:  Dr. Leighton Ruff  Primary Discharge Diagnosis:  1.  Symptomatic complete heart block  Secondary Discharge Diagnosis: 2.  Hypertension 3.  Stage III chronic kidney disease 4.  History of breast cancer  Procedures:  Insertion of permanent transvenous pacemaker  Consults:  Dr. Lillia Abed Course: This 81 year old female has a history of hypertension and hyperlipidemia.  She developed dizziness about 4 days prior to admission and had been using her walker to get around so that it would decrease her risk of falling.  She waited and saw her primary physician and was found to be in complete heart block and sent to the emergency room.  She had a sustained normal blood pressure and was admitted for further evaluation.  She remained in symptomatic complete heart block with dizziness but a normal blood pressure.  She had her atenolol withdrawn and remained in complete heart block.  She had consultation with lunch physiology felt that she would need a placement of a pacemaker.  She underwent placement of a permanent Medtronic pacemaker with an Adapta serial number [id number] pacemaker generator and a Medtronic 5076 atrial lead serial number [id number] in [id number], ventricular lead was a Medtronic 7035 serial number [id number].  She tolerated this well and her symptoms improved.  She had mild elevation of creatinine which was at her baseline.  She was discharged in improved condition and had pacemaker discharge instructions given to her and will return to [employer].  Discharge Exam: Blood pressure 134/64, pulse 66, temperature 98.4 F (36.9 C), temperature source Oral, resp. rate 18, height 5\' 2"  (1.575 m), weight 62.1 kg (136 lb 14.5 oz), SpO2 96 %. Weight: 62.1 kg (136 lb 14.5 oz) Pacemaker site is clean and dry,  lungs are clear, no rub is noted  Labs: CBC:   Lab Results  Component Value Date   WBC 8.4 11/12/2014   HGB 11.1* 11/12/2014   HCT 33.5* 11/12/2014   MCV 96.5 11/12/2014   PLT 246 11/12/2014    CMP:  Recent Labs Lab 11/14/14 0530  NA 140  K 4.6  CL 107  CO2 27  BUN 21  CREATININE 1.61*  CALCIUM 8.7  GLUCOSE 96   Thyroid: Lab Results  Component Value Date   TSH 2.354 11/12/2014    Hemoglobin A1C: Lab Results  Component Value Date   HGBA1C 5.7* 11/12/2014     Radiology: Portable chest x-ray shows cardiomegaly and pulmonary venous congestion.  The post insertion x-ray shows no pneumothorax and pacemaker leads in place.  EKG: Complete heart block  Discharge Medications:   Medication List    TAKE these medications        atenolol 50 MG tablet  Commonly known as:  TENORMIN  Take 25 mg by mouth every evening.     Bromfenac Sodium 0.07 % Soln  Place 1 drop into both eyes daily.     CALCIUM 600 + D PO  Take 1 tablet by mouth daily.     FISH OIL + D3 1000-1000 MG-UNIT Caps  Take 1 capsule by mouth daily.     Glucosamine-Chondroitin 250-200 MG Tabs  Take 1 tablet by mouth daily.     loratadine 10 MG tablet  Commonly known as:  CLARITIN  Take 10 mg by mouth daily.     losartan 50 MG tablet  Commonly known as:  COZAAR  Take 50 mg by mouth every morning.     prednisoLONE acetate 1 % ophthalmic suspension  Commonly known as:  PRED FORTE  Place 1 drop into both eyes 2 (two) times daily.     simvastatin 20 MG tablet  Commonly known as:  ZOCOR  Take 10 mg by mouth daily.     traMADol 50 MG tablet  Commonly known as:  ULTRAM  Take 50 mg by mouth every 6 (six) hours as needed.         Followup plans and appointments: See Dr. Rayann Heman for a wound check in 10 days.  See him in 3 months.  Follow-up with me in 2 weeks.  Time spent with patient to include physician time:  30 minutes Signed: W. Doristine Church. MD [facility] 11/14/2014, 9:03 AM

## 2014-11-14 NOTE — Telephone Encounter (Signed)
Left message for patient that Dr Rayann Heman does not typically give pain medications after the procedure at discharge.  She can try Advil and or Tylenol.  This is not something typically given at discharge

## 2014-11-14 NOTE — Discharge Instructions (Signed)
° °  Supplemental Discharge Instructions for  Pacemaker/Defibrillator Patients  Activity No heavy lifting or vigorous activity with your left/right arm for 6 to 8 weeks.  Do not raise your left/right arm above your head for one week.  Gradually raise your affected arm as drawn below.                       02/06                   02/07                    02/08                    02/09            NO DRIVING till cleared by MD [place] the wound area clean and dry.  Do not get this area wet for one week. No showers for one week; you may shower on      02/10        . - The tape/steri-strips on your wound will fall off; do not pull them off.  No bandage is needed on the site.  DO  NOT apply any creams, oils, or ointments to the wound area. - If you notice any drainage or discharge from the wound, any swelling or bruising at the site, or you develop a fever > 101? F after you are discharged home, call the office at once.  Special Instructions - You are still able to use cellular telephones; use the ear opposite the side where you have your pacemaker/defibrillator.  Avoid carrying your cellular phone near your device. - When traveling through airports, show security personnel your identification card to avoid being screened in the metal detectors.  Ask the security personnel to use the hand wand. - Avoid arc welding equipment, MRI testing (magnetic resonance imaging), TENS units (transcutaneous nerve stimulators).  Call the office for questions about other devices. - Avoid electrical appliances that are in poor condition or are not properly grounded. - Microwave ovens are safe to be near or to operate.  Additional information for defibrillator patients should your device go off: - If your device goes off ONCE and you feel fine afterward, notify the device clinic nurses. - If your device goes off ONCE and you do not feel well afterward, call 911. - If your device goes off TWICE, call  911. - If your device goes off THREE times in one day, call 911.  DO NOT DRIVE YOURSELF OR A FAMILY MEMBER WITH A DEFIBRILLATOR TO THE HOSPITAL--CALL 911.

## 2014-11-14 NOTE — Progress Notes (Signed)
PT Cancellation Note  Patient Details Name: [name] MRN: [id number] DOB: [birth year]   Cancelled Treatment:    Reason Eval/Treat Not Completed: PT screened, no needs identified, will sign off. Per case management Almyra Free) pt leaving today for [facility] and does not need PT eval. Please re-consult if needed in future.   Kingsley Callander 11/14/2014, 2:00 PM   Kittie Plater, PT, DPT Pager #: [phone] Office #: [phone]

## 2014-11-14 NOTE — Evaluation (Signed)
Occupational Therapy Evaluation Patient Details Name: [name] MRN: [id number] DOB: [birth year] Today's Date: 11/14/2014    History of Present Illness This 81 y.o. female admitted with symptomatic complete heart block.  Pt underwent placement of pacemaker 11/13/14.  PMH includes:  HTN; CKD stage III; h/o breast CA.    Clinical Impression   Pt admitted with above.  Currently, she presents to OT with generalized weakness, decreased balance, and decreased UE function.   She currently requires min A - mod A for BADLs and will benefit from continued OT at SNF level rehab to allow her to regain independence with BADLs (pt was independent PTA).   She lives alone and will need SNF level rehab at discharge.       Follow Up Recommendations  SNF    Equipment Recommendations  None recommended by OT    Recommendations for Other Services       Precautions / Restrictions Precautions Precautions: Fall;ICD/Pacemaker Precaution Comments: Pt is able to verbalize understanding of pacemaker precautions.  Sling in place  Restrictions Weight Bearing Restrictions: No      Mobility Bed Mobility Overal bed mobility: Needs Assistance Bed Mobility: Supine to Sit;Sit to Supine     Supine to sit: Min assist Sit to supine: Min guard   General bed mobility comments: Requires assist to lift trunk due to inability to push self up with Lt. UE  Transfers Overall transfer level: Needs assistance Equipment used: 1 person hand held assist Transfers: Sit to/from [facility] Sit to Stand: Min guard Stand pivot transfers: Min assist       General transfer comment: min A to steady     Balance Overall balance assessment: Needs assistance Sitting-balance support: Feet supported Sitting balance-Leahy Scale: Good     Standing balance support: During functional activity Standing balance-Leahy Scale: Fair                              ADL Overall ADL's : Needs  assistance/impaired Eating/Feeding: Set up;Sitting   Grooming: Wash/dry hands;Wash/dry face;Oral care;Brushing hair;Minimal assistance;Standing   Upper Body Bathing: Minimal assitance;Sitting   Lower Body Bathing: Minimal assistance;Sit to/from stand   Upper Body Dressing : Moderate assistance;Sitting   Lower Body Dressing: Minimal assistance;Sit to/from stand   Toilet Transfer: Minimal assistance;Ambulation;Comfort height toilet   Toileting- Clothing Manipulation and Hygiene: Minimal assistance;Sit to/from stand       Functional mobility during ADLs: Minimal assistance (HHA) General ADL Comments: Pt requires assist for balance and due to immobilization Lt. UE     Vision                     Perception     Praxis      Pertinent Vitals/Pain       Hand Dominance Right   Extremity/Trunk Assessment Upper Extremity Assessment Upper Extremity Assessment: LUE deficits/detail LUE Deficits / Details: Pt currently in sling . elbow distally WFL.  Shoulder not assessed due to precautions  LUE: Unable to fully assess due to immobilization   Lower Extremity Assessment Lower Extremity Assessment: Defer to PT evaluation   Cervical / Trunk Assessment Cervical / Trunk Assessment: Normal   Communication Communication Communication: No difficulties   Cognition Arousal/Alertness: Awake/alert Behavior During Therapy: WFL for tasks assessed/performed Overall Cognitive Status: Within Functional Limits for tasks assessed  General Comments       Exercises       Shoulder Instructions      Home Living Family/patient expects to be discharged to:: Skilled nursing facility                                 Additional Comments: Pt resides in independent living apt at [facility].  Plan to discharge to SNF at [facility]       Prior Functioning/Environment Level of Independence: Independent        Comments: Pt reports she  fell ~ 3 mos ago going to bathroom in middle of night.  She typically does not use DME for ambulation and goes to exercise classes daily     OT Diagnosis: Generalized weakness   OT Problem List: Decreased strength;Decreased range of motion;Decreased activity tolerance;Impaired balance (sitting and/or standing);Impaired UE functional use   OT Treatment/Interventions:      OT Goals(Current goals can be found in the care plan section) Acute Rehab OT Goals Patient Stated Goal: to regain independence  OT Goal Formulation: All assessment and education complete, DC therapy  OT Frequency:     Barriers to D/C:            Co-evaluation              End of Session Equipment Utilized During Treatment: Other (comment) (sling ) Nurse Communication: Mobility status  Activity Tolerance: Patient tolerated treatment well Patient left: in bed;with call bell/phone within reach;with family/visitor present   Time: 1211-1233 OT Time Calculation (min): 22 min Charges:  OT General Charges $OT Visit: 1 Procedure OT Evaluation $Initial OT Evaluation Tier I: 1 Procedure G-Codes:    Lucille Passy M 2014/11/25, 12:46 PM

## 2014-11-14 NOTE — Progress Notes (Signed)
Discharge education completed by RN. Pt and daughter received a copy of discharge paperwork and confirm understanding of follow up appointments and discharge medications. Both deny any questions at this time. IV removed, site is within normal limits. Pt will discharge from the unit via wheelchair. Family will transport pt to [facility]. Report called to K. Tidwell RN at facility.

## 2014-11-14 NOTE — Telephone Encounter (Signed)
New message      Pt had a pacemaker put in yesterday---want a presc for vicodin called in to [place] at [facility].  Please call pt and let her know what doctor says.  She got this medication while in the hosp

## 2014-11-22 ENCOUNTER — Ambulatory Visit (INDEPENDENT_AMBULATORY_CARE_PROVIDER_SITE_OTHER): Payer: Medicare Other | Admitting: *Deleted

## 2014-11-22 DIAGNOSIS — I442 Atrioventricular block, complete: Secondary | ICD-10-CM

## 2014-11-22 LAB — MDC_IDC_ENUM_SESS_TYPE_INCLINIC
Battery Voltage: 2.8 V
Brady Statistic AP VS Percent: 0 %
Brady Statistic AS VP Percent: 15 %
Brady Statistic AS VS Percent: 0 %
Lead Channel Impedance Value: 462 Ohm
Lead Channel Pacing Threshold Amplitude: 0.5 V
Lead Channel Pacing Threshold Pulse Width: 0.4 ms
Lead Channel Pacing Threshold Pulse Width: 0.4 ms
Lead Channel Sensing Intrinsic Amplitude: 15.67 mV
Lead Channel Setting Pacing Amplitude: 3.5 V
Lead Channel Setting Pacing Pulse Width: 0.4 ms
Lead Channel Setting Sensing Sensitivity: 4 mV
MDC IDC MSMT BATTERY IMPEDANCE: 100 Ohm
MDC IDC MSMT BATTERY REMAINING LONGEVITY: 108 mo
MDC IDC MSMT LEADCHNL RA PACING THRESHOLD AMPLITUDE: 0.5 V
MDC IDC MSMT LEADCHNL RA SENSING INTR AMPL: 0.7 mV
MDC IDC MSMT LEADCHNL RV IMPEDANCE VALUE: 725 Ohm
MDC IDC SESS DTM: 20160211105556
MDC IDC SET LEADCHNL RA PACING AMPLITUDE: 3.5 V
MDC IDC STAT BRADY AP VP PERCENT: 85 %

## 2014-11-22 NOTE — Progress Notes (Signed)
Wound check appointment. Steri-strips removed. Wound without redness or edema. Incision edges approximated, wound well healed. Normal device function. Thresholds, sensing, and impedances consistent with implant measurements. Device programmed at 3.5V/auto capture programmed on for extra safety margin until 3 month visit. Histogram distribution blunted, rate response programmed on today.  No mode switches or high ventricular rates noted. Patient educated about wound care, arm mobility, lifting restrictions. ROV in 3 months with implanting physician then released to Dr. Wynonia Lawman.

## 2014-11-30 ENCOUNTER — Encounter: Payer: Self-pay | Admitting: Internal Medicine

## 2015-02-18 ENCOUNTER — Encounter: Payer: Self-pay | Admitting: Internal Medicine

## 2015-02-18 ENCOUNTER — Ambulatory Visit (INDEPENDENT_AMBULATORY_CARE_PROVIDER_SITE_OTHER): Payer: Medicare Other | Admitting: Internal Medicine

## 2015-02-18 VITALS — BP 118/76 | HR 81 | Ht 62.0 in | Wt 139.4 lb

## 2015-02-18 DIAGNOSIS — I442 Atrioventricular block, complete: Secondary | ICD-10-CM | POA: Diagnosis not present

## 2015-02-18 LAB — CUP PACEART INCLINIC DEVICE CHECK
Battery Remaining Longevity: 98 mo
Brady Statistic AP VP Percent: 94 %
Brady Statistic AP VS Percent: 0 %
Brady Statistic AS VS Percent: 2 %
Date Time Interrogation Session: 20160509134231
Lead Channel Impedance Value: 691 Ohm
Lead Channel Pacing Threshold Amplitude: 0.5 V
Lead Channel Pacing Threshold Pulse Width: 0.4 ms
Lead Channel Pacing Threshold Pulse Width: 0.4 ms
Lead Channel Sensing Intrinsic Amplitude: 15.67 mV
Lead Channel Setting Pacing Amplitude: 2.5 V
Lead Channel Setting Sensing Sensitivity: 4 mV
MDC IDC MSMT BATTERY IMPEDANCE: 100 Ohm
MDC IDC MSMT BATTERY VOLTAGE: 2.8 V
MDC IDC MSMT LEADCHNL RA IMPEDANCE VALUE: 449 Ohm
MDC IDC MSMT LEADCHNL RA PACING THRESHOLD AMPLITUDE: 0.5 V
MDC IDC MSMT LEADCHNL RA SENSING INTR AMPL: 2 mV
MDC IDC SET LEADCHNL RA PACING AMPLITUDE: 2 V
MDC IDC SET LEADCHNL RV PACING PULSEWIDTH: 0.4 ms
MDC IDC STAT BRADY AS VP PERCENT: 5 %

## 2015-02-18 NOTE — Progress Notes (Signed)
Electrophysiology Office Note   Date:  02/18/2015   ID:  [name], DOB Nov 21, 1928, MRN [id number]  PCP:  Gerrit Heck, MD  Cardiologist:  Dr Wynonia Lawman Primary Electrophysiologist: Thompson Grayer, MD    Chief Complaint  Patient presents with  . Symptomatic Complete Heart Block     History of Present Illness: [name] is a 81 y.o. female who presents today for electrophysiology evaluation.   She has done very well since her PPM implant.  Her energy/ exercise tolerance is much improved.  She is pleased with her results and denies procedure related complications.  Today, she denies symptoms of palpitations, chest pain, shortness of breath, orthopnea, PND, lower extremity edema, claudication, dizziness, presyncope, syncope, bleeding, or neurologic sequela. The patient is tolerating medications without difficulties and is otherwise without complaint today.    Past Medical History  Diagnosis Date  . Hypertension   . Osteoporosis   . Arthritis   . Breast cancer   . Complete heart block     a. s/p MDT dual chamber pacemaker Dr Rayann Heman  . Chronic kidney disease    Past Surgical History  Procedure Laterality Date  . Pacemaker insertion  11/13/14    MDT dual chamber pacemaker implanted by Dr Rayann Heman for CHB  . Permanent pacemaker insertion N/A 11/13/2014    Procedure: PERMANENT PACEMAKER INSERTION;  Surgeon: Thompson Grayer, MD;  Location: [facility] CATH LAB;  Service: Cardiovascular;  Laterality: N/A;     Current Outpatient Prescriptions  Medication Sig Dispense Refill  . atenolol (TENORMIN) 50 MG tablet Take 25 mg by mouth every evening.    . Calcium Carb-Cholecalciferol (CALCIUM 600 + D PO) Take 1 tablet by mouth daily.    . Fish Oil-Cholecalciferol (FISH OIL + D3) 1000-1000 MG-UNIT CAPS Take 1 capsule by mouth daily.    . Glucosamine-Chondroitin 250-200 MG TABS Take 1 tablet by mouth daily.    Marland Kitchen loratadine (CLARITIN) 10 MG tablet Take 10 mg by mouth daily.    Marland Kitchen losartan  (COZAAR) 50 MG tablet Take 50 mg by mouth at bedtime.     Marland Kitchen MELATONIN PO Take 1 capsule by mouth at bedtime as needed (sleep).    . Probiotic Product (PROBIOTIC DAILY PO) Take 1 capsule by mouth daily.    . simvastatin (ZOCOR) 20 MG tablet Take 10 mg by mouth daily.    Marland Kitchen HYDROcodone-acetaminophen (NORCO/VICODIN) 5-325 MG per tablet Take 1 tablet by mouth every 6 (six) hours as needed for moderate pain.    . methocarbamol (ROBAXIN) 500 MG tablet Take 500 mg by mouth every 6 (six) hours as needed for muscle spasms.    . traMADol (ULTRAM) 50 MG tablet Take 50-100 mg by mouth every 6 (six) hours as needed (pain).     No current facility-administered medications for this visit.    Allergies:   Molds & smuts   Social History:  The patient  reports that she has never smoked. She does not have any smokeless tobacco history on file. She reports that she drinks alcohol. She reports that she does not use illicit drugs.   Family History:  The patient's family history includes Alcoholism in her brother and father; CVA in her mother; Cancer in her maternal aunt, sister, and sister; Cirrhosis in her brother; Hyperlipidemia in her mother; Hypertension in her mother; Other (age of onset: 42) in her father; Transient ischemic attack in her mother.    ROS:  Please see the history of present illness.   All  other systems are reviewed and negative.    PHYSICAL EXAM: VS:  BP 118/76 mmHg  Pulse 81  Ht 5\' 2"  (1.575 m)  Wt 139 lb 6.4 oz (63.231 kg)  BMI 25.49 kg/m2 , BMI Body mass index is 25.49 kg/(m^2). GEN: Well nourished, well developed, in no acute distress HEENT: normal Neck: no JVD, carotid bruits, or masses Cardiac: RRR; no murmurs, rubs, or gallops,no edema  Respiratory:  clear to auscultation bilaterally, normal work of breathing GI: soft, nontender, nondistended, + BS MS: no deformity or atrophy Skin: warm and dry, device pocket is well healed Neuro:  Strength and sensation are intact Psych:  euthymic mood, full affect  EKG:  EKG is ordered today. The ekg ordered today shows sinus rhythm with pseudofusion  Device interrogation is reviewed today in detail.  See PaceArt for details.   Recent Labs: 11/12/2014: Hemoglobin 11.1*; Platelets 246; TSH 2.354 11/14/2014: BUN 21; Creatinine 1.61*; Potassium 4.6; Sodium 140    Lipid Panel  No results found for: CHOL, TRIG, HDL, CHOLHDL, VLDL, LDLCALC, LDLDIRECT   Wt Readings from Last 3 Encounters:  02/18/15 139 lb 6.4 oz (63.231 kg)  11/14/14 136 lb 14.5 oz (62.1 kg)     ASSESSMENT AND PLAN:  1.  Complete heart block She has had some improvement in AV conduction and has pseudofusion on ekg today I have therefore turned search AV on and programmed with extension of AV to avoid V pacing as able  She will follow with Dr Wynonia Lawman going forward.  I am happy to see as needed.   Army Fossa, MD  02/18/2015 11:08 AM     [facility] [address] [place] [address] [phone] (office) [phone] (fax)

## 2015-02-18 NOTE — Patient Instructions (Signed)
Medication Instructions:  Your physician recommends that you continue on your current medications as directed. Please refer to the Current Medication list given to you today.   Labwork: None ordered  Testing/Procedures: None ordered  Follow-Up: Your physician recommends that you schedule a follow-up appointment with Dr Wynonia Lawman and as needed with Dr Rayann Heman   Any Other Special Instructions Will Be Listed Below (If Applicable).

## 2015-02-20 ENCOUNTER — Telehealth: Payer: Self-pay | Admitting: Internal Medicine

## 2015-02-20 NOTE — Telephone Encounter (Signed)
New message      Pt was seen Monday---02-18-15.  She had her pacemaker adjusted.  Yesterday afternoon, pt was lightheaded.  She is also lightheaded this am.  Could this be from the pacemaker adjustment?

## 2015-02-21 ENCOUNTER — Ambulatory Visit (INDEPENDENT_AMBULATORY_CARE_PROVIDER_SITE_OTHER): Payer: Medicare Other | Admitting: *Deleted

## 2015-02-21 DIAGNOSIS — I442 Atrioventricular block, complete: Secondary | ICD-10-CM

## 2015-02-21 LAB — CUP PACEART INCLINIC DEVICE CHECK
Brady Statistic AP VS Percent: 100 %
Brady Statistic AS VP Percent: 0 %
Brady Statistic AS VS Percent: 0 %
Date Time Interrogation Session: 20160512115603
Lead Channel Pacing Threshold Amplitude: 0.5 V
Lead Channel Pacing Threshold Amplitude: 0.5 V
Lead Channel Pacing Threshold Pulse Width: 0.4 ms
Lead Channel Pacing Threshold Pulse Width: 0.4 ms
Lead Channel Sensing Intrinsic Amplitude: 15.67 mV
Lead Channel Setting Pacing Pulse Width: 0.4 ms
Lead Channel Setting Sensing Sensitivity: 4 mV
MDC IDC MSMT BATTERY IMPEDANCE: 100 Ohm
MDC IDC MSMT BATTERY REMAINING LONGEVITY: 153 mo
MDC IDC MSMT BATTERY VOLTAGE: 2.8 V
MDC IDC MSMT LEADCHNL RA IMPEDANCE VALUE: 467 Ohm
MDC IDC MSMT LEADCHNL RV IMPEDANCE VALUE: 630 Ohm
MDC IDC SET LEADCHNL RA PACING AMPLITUDE: 2 V
MDC IDC SET LEADCHNL RV PACING AMPLITUDE: 2.5 V
MDC IDC STAT BRADY AP VP PERCENT: 0 %

## 2015-02-21 NOTE — Telephone Encounter (Signed)
I worry that she her symptoms could be suggestive of stroke.  Would encourage her to call PCP/ consider urgent care/ ED evaluation if not improved.

## 2015-02-21 NOTE — Progress Notes (Signed)
Pacemaker check in clinic related to new dizziness/ drifting leftward while ambulating. Clear speech, no extremity weakness. Normal device function. Thresholds, sensing, impedances consistent with previous measurements. Device programmed to maximize longevity. No mode switch or high ventricular rates noted. Device programmed at appropriate safety margins. Histogram distribution appropriate for patient activity level. Device reprogrammed to shorter AV delays and Search AV turned off as previously programmed. Hall walk after changes made- pt reported no change. Instructed to follow up with PCP if no improvement noticed in 24 hours. Estimated longevity 12.5 years. Patient to be followed by Dr. Wynonia Lawman hereafter.

## 2015-02-21 NOTE — Telephone Encounter (Signed)
Spoke with patient about c/o intermittent dizziness x 2 days. She reports drifting to the left and that she has to reach out for things while moving around the house. She reports no changes in her medication regimen or health otherwise- speech is clear, denies allergy sx. She checked BP- reading 144/91 which she states is her normal. I would like to bring her in to check device- she will try to find a ride here as she does not feel comfortable driving. She will call back to schedule once she has transportation arranged.

## 2015-02-22 NOTE — Telephone Encounter (Signed)
Upon evaluation- clear speech, no extremity weakness, pt denies change in vision and allergy symptoms. Instructed her to call PCP if symptoms not improved within 24 hours of device reprogramming. Pt verbalizes understanding.

## 2015-03-04 ENCOUNTER — Other Ambulatory Visit: Payer: Self-pay

## 2015-03-04 ENCOUNTER — Telehealth: Payer: Self-pay | Admitting: Internal Medicine

## 2015-03-04 DIAGNOSIS — Z1231 Encounter for screening mammogram for malignant neoplasm of breast: Secondary | ICD-10-CM

## 2015-03-04 NOTE — Telephone Encounter (Signed)
Patient's symptoms of dizziness resolved- pt reports that it was an inner ear problem that has since resolved. She would like to come to the office to be reprogrammed since that was not the cause of her symptoms. Device clinic appt for 03/05/15 at 1pm made.

## 2015-03-04 NOTE — Telephone Encounter (Signed)
Follow Up       Pt returning Emma's phone call.

## 2015-03-04 NOTE — Telephone Encounter (Signed)
New Message   Patient is calling c/o pacemaker working to fast need to slow it down.    1. Has your device fired? no  2. Is you device beeping? No   3. Are you experiencing draining or swelling at device site? No   4. Are you calling to see if we received your device transmission? No   5. Have you passed out? No

## 2015-03-04 NOTE — Telephone Encounter (Signed)
LMOM returning pts call

## 2015-03-05 ENCOUNTER — Ambulatory Visit (INDEPENDENT_AMBULATORY_CARE_PROVIDER_SITE_OTHER): Payer: Medicare Other | Admitting: *Deleted

## 2015-03-05 DIAGNOSIS — I442 Atrioventricular block, complete: Secondary | ICD-10-CM

## 2015-03-05 LAB — CUP PACEART INCLINIC DEVICE CHECK
Battery Impedance: 100 Ohm
Battery Remaining Longevity: 127 mo
Battery Voltage: 2.79 V
Brady Statistic AP VS Percent: 0 %
Brady Statistic AS VP Percent: 4 %
Lead Channel Impedance Value: 678 Ohm
Lead Channel Setting Pacing Amplitude: 2 V
Lead Channel Setting Pacing Amplitude: 2.5 V
MDC IDC MSMT LEADCHNL RA IMPEDANCE VALUE: 448 Ohm
MDC IDC SESS DTM: 20160524133038
MDC IDC SET LEADCHNL RV PACING PULSEWIDTH: 0.4 ms
MDC IDC SET LEADCHNL RV SENSING SENSITIVITY: 4 mV
MDC IDC STAT BRADY AP VP PERCENT: 96 %
MDC IDC STAT BRADY AS VS PERCENT: 0 %

## 2015-03-05 NOTE — Progress Notes (Signed)
Pacer check in clinic for optimization. Pt has s/s r/t inner ear issues shortly after optimization to minimize RVP-- reprogrammed to r/o changes as cause. Pt presents today to have device changed back to minimize RVP-- AV delays set to 200 with Search AV turned on with 157ms extension. Pt will call if symptoms arrise shortly after change. Pacer to be followed by Dr. Wynonia Lawman.

## 2015-03-12 ENCOUNTER — Encounter: Payer: Self-pay | Admitting: Internal Medicine

## 2015-04-01 ENCOUNTER — Ambulatory Visit: Payer: Medicare Other

## 2015-04-02 ENCOUNTER — Ambulatory Visit
Admission: RE | Admit: 2015-04-02 | Discharge: 2015-04-02 | Disposition: A | Discharge status: Home / Self Care | Payer: Medicare Other | Source: Ambulatory Visit

## 2015-04-02 DIAGNOSIS — Z1231 Encounter for screening mammogram for malignant neoplasm of breast: Secondary | ICD-10-CM

## 2015-07-24 ENCOUNTER — Encounter (HOSPITAL_BASED_OUTPATIENT_CLINIC_OR_DEPARTMENT_OTHER): Payer: Self-pay

## 2015-07-24 ENCOUNTER — Emergency Department (HOSPITAL_BASED_OUTPATIENT_CLINIC_OR_DEPARTMENT_OTHER): Payer: Medicare Other

## 2015-07-24 ENCOUNTER — Emergency Department (HOSPITAL_BASED_OUTPATIENT_CLINIC_OR_DEPARTMENT_OTHER)
Admission: EM | Admit: 2015-07-24 | Discharge: 2015-07-24 | Disposition: A | Discharge status: Home / Self Care | Payer: Medicare Other | Attending: Emergency Medicine | Admitting: Emergency Medicine

## 2015-07-24 DIAGNOSIS — S0992XA Unspecified injury of nose, initial encounter: Secondary | ICD-10-CM | POA: Diagnosis present

## 2015-07-24 DIAGNOSIS — I129 Hypertensive chronic kidney disease with stage 1 through stage 4 chronic kidney disease, or unspecified chronic kidney disease: Secondary | ICD-10-CM | POA: Diagnosis not present

## 2015-07-24 DIAGNOSIS — S065X9A Traumatic subdural hemorrhage with loss of consciousness of unspecified duration, initial encounter: Secondary | ICD-10-CM

## 2015-07-24 DIAGNOSIS — T148XXA Other injury of unspecified body region, initial encounter: Secondary | ICD-10-CM

## 2015-07-24 DIAGNOSIS — S065X0A Traumatic subdural hemorrhage without loss of consciousness, initial encounter: Secondary | ICD-10-CM | POA: Diagnosis not present

## 2015-07-24 DIAGNOSIS — M1990 Unspecified osteoarthritis, unspecified site: Secondary | ICD-10-CM | POA: Insufficient documentation

## 2015-07-24 DIAGNOSIS — W101XXA Fall (on)(from) sidewalk curb, initial encounter: Secondary | ICD-10-CM | POA: Diagnosis not present

## 2015-07-24 DIAGNOSIS — M810 Age-related osteoporosis without current pathological fracture: Secondary | ICD-10-CM | POA: Insufficient documentation

## 2015-07-24 DIAGNOSIS — S022XXA Fracture of nasal bones, initial encounter for closed fracture: Secondary | ICD-10-CM | POA: Diagnosis not present

## 2015-07-24 DIAGNOSIS — Y998 Other external cause status: Secondary | ICD-10-CM | POA: Diagnosis not present

## 2015-07-24 DIAGNOSIS — Y9289 Other specified places as the place of occurrence of the external cause: Secondary | ICD-10-CM | POA: Diagnosis not present

## 2015-07-24 DIAGNOSIS — S0081XA Abrasion of other part of head, initial encounter: Secondary | ICD-10-CM | POA: Diagnosis not present

## 2015-07-24 DIAGNOSIS — R040 Epistaxis: Secondary | ICD-10-CM | POA: Insufficient documentation

## 2015-07-24 DIAGNOSIS — Z950 Presence of cardiac pacemaker: Secondary | ICD-10-CM | POA: Insufficient documentation

## 2015-07-24 DIAGNOSIS — N189 Chronic kidney disease, unspecified: Secondary | ICD-10-CM | POA: Insufficient documentation

## 2015-07-24 DIAGNOSIS — Y9301 Activity, walking, marching and hiking: Secondary | ICD-10-CM | POA: Insufficient documentation

## 2015-07-24 DIAGNOSIS — Z853 Personal history of malignant neoplasm of breast: Secondary | ICD-10-CM | POA: Insufficient documentation

## 2015-07-24 DIAGNOSIS — S065XAA Traumatic subdural hemorrhage with loss of consciousness status unknown, initial encounter: Secondary | ICD-10-CM

## 2015-07-24 DIAGNOSIS — Z79899 Other long term (current) drug therapy: Secondary | ICD-10-CM | POA: Insufficient documentation

## 2015-07-24 DIAGNOSIS — W19XXXA Unspecified fall, initial encounter: Secondary | ICD-10-CM

## 2015-07-24 HISTORY — DX: Dizziness and giddiness: R42

## 2015-07-24 NOTE — ED Notes (Signed)
PT assisted to bedpan without difficulty. No change in neuro status.

## 2015-07-24 NOTE — Discharge Instructions (Signed)
Continue using Tylenol and ice at home for pain relief.  Follow-up with Dr. Saintclair Halsted (neurosurgery) in his office tomorrow morning. Please return to the emergency department if symptoms worsen or new onset of change in mental status, numbness, tingling, weakness, lightheadedness, dizziness, headache.

## 2015-07-24 NOTE — ED Provider Notes (Signed)
CSN: [id number]     Arrival date & time 07/24/15  1208 History   First MD Initiated Contact with Patient 07/24/15 1234     Chief Complaint  Patient presents with  . Fall     (Consider location/radiation/quality/duration/timing/severity/associated sxs/prior Treatment) HPI Comments: Patient is a 81 year old female with past medical history of HTN, bilateral knee replacement, pacemaker who presents to the ED s/p fall. Patient reports she was walking on [address] outside when she tripped over the curb and fell on the sand. Denies LOC. Endorses nosebleed, nose pain, right palm pain, bilateral knee pain. Denies headache, dizziness, lightheadedness, change in vision, neck pain, back pain, SOB, chest pain, abdominal pain, N/V/D, numbness, tingling, weakness. Patient is not on any blood thinners. Patient reports placing ice on knees PTA with mild relief. She has used gauze in her nostrils to control bleeding and reports that nose bleeding has since improved.  Patient is a 81 y.o. female presenting with fall.  Fall Associated symptoms include arthralgias and joint swelling.    Past Medical History  Diagnosis Date  . Hypertension   . Osteoporosis   . Arthritis   . Breast cancer ([place])   . Complete heart block (HCC)     a. s/p MDT dual chamber pacemaker Dr Rayann Heman  . Chronic kidney disease   . Vertigo    Past Surgical History  Procedure Laterality Date  . Pacemaker insertion  11/13/14    MDT dual chamber pacemaker implanted by Dr Rayann Heman for CHB  . Permanent pacemaker insertion N/A 11/13/2014    Procedure: PERMANENT PACEMAKER INSERTION;  Surgeon: Thompson Grayer, MD;  Location: [facility] CATH LAB;  Service: Cardiovascular;  Laterality: N/A;   Family History  Problem Relation Age of Onset  . Hypertension Mother     possibly per pt  . Hyperlipidemia Mother     possibly per pt  . Transient ischemic attack Mother     multiple  . CVA Mother   . Alcoholism Father   . Other Father 79    cerebral  hemorrhage  . Cirrhosis Brother   . Alcoholism Brother   . Cancer Maternal Aunt     lung  . Cancer Sister     breast  . Cancer Sister     breast   Social History  Substance Use Topics  . Smoking status: Never Smoker   . Smokeless tobacco: None  . Alcohol Use: Yes     Comment: rarely   OB History    No data available     Review of Systems  HENT: Positive for nosebleeds.   Musculoskeletal: Positive for joint swelling and arthralgias.  All other systems reviewed and are negative.     Allergies  Molds & smuts  Home Medications   Prior to Admission medications   Medication Sig Start Date End Date Taking? Authorizing Provider  atenolol (TENORMIN) 50 MG tablet Take 25 mg by mouth every evening. 08/09/14   Historical Provider, MD  Calcium Carb-Cholecalciferol (CALCIUM 600 + D PO) Take 1 tablet by mouth daily.    Historical Provider, MD  Fish Oil-Cholecalciferol (FISH OIL + D3) 1000-1000 MG-UNIT CAPS Take 1 capsule by mouth daily.    Historical Provider, MD  Glucosamine-Chondroitin 250-200 MG TABS Take 1 tablet by mouth daily.    Historical Provider, MD  HYDROcodone-acetaminophen (NORCO/VICODIN) 5-325 MG per tablet Take 1 tablet by mouth every 6 (six) hours as needed for moderate pain.    Historical Provider, MD  loratadine (CLARITIN) 10 MG  tablet Take 10 mg by mouth daily.    Historical Provider, MD  losartan (COZAAR) 50 MG tablet Take 50 mg by mouth at bedtime.  10/03/14   Historical Provider, MD  MELATONIN PO Take 1 capsule by mouth at bedtime as needed (sleep).    Historical Provider, MD  methocarbamol (ROBAXIN) 500 MG tablet Take 500 mg by mouth every 6 (six) hours as needed for muscle spasms.    Historical Provider, MD  Probiotic Product (PROBIOTIC DAILY PO) Take 1 capsule by mouth daily.    Historical Provider, MD  simvastatin (ZOCOR) 20 MG tablet Take 10 mg by mouth daily. 09/10/14   Historical Provider, MD  traMADol (ULTRAM) 50 MG tablet Take 50-100 mg by mouth every 6  (six) hours as needed (pain).    Historical Provider, MD   BP 155/69 mmHg  Pulse 73  Temp(Src) 98.6 F (37 C) (Oral)  Resp 18  Ht 5\' 2"  (1.575 m)  Wt 140 lb (63.504 kg)  BMI 25.60 kg/m2  SpO2 100% Physical Exam  Constitutional: She is oriented to person, place, and time. She appears well-developed and well-nourished.  HENT:  Head: Normocephalic. Head is with abrasion. Head is without raccoon's eyes, without Battle's sign, without contusion and without laceration.    Right Ear: Tympanic membrane normal. No hemotympanum.  Left Ear: Tympanic membrane normal. No hemotympanum.  Nose: Sinus tenderness present. No nose lacerations, nasal deformity, septal deviation or nasal septal hematoma. Epistaxis is observed.  No foreign bodies. Right sinus exhibits no maxillary sinus tenderness and no frontal sinus tenderness. Left sinus exhibits no maxillary sinus tenderness and no frontal sinus tenderness.    Mouth/Throat: Uvula is midline, oropharynx is clear and moist and mucous membranes are normal. No oropharyngeal exudate.  Eyes: Conjunctivae and EOM are normal. Pupils are equal, round, and reactive to light. Right eye exhibits no discharge. Left eye exhibits no discharge. No scleral icterus.  Neck: Normal range of motion. Neck supple.  Cardiovascular: Normal rate, regular rhythm, normal heart sounds and intact distal pulses.   No murmur heard. Pulmonary/Chest: Effort normal and breath sounds normal. No respiratory distress. She has no wheezes. She has no rales. She exhibits no tenderness.  Abdominal: Soft. Bowel sounds are normal. She exhibits no distension and no mass. There is no tenderness. There is no rebound and no guarding.  Musculoskeletal: Normal range of motion. She exhibits edema and tenderness.       Right knee: She exhibits swelling. She exhibits normal range of motion, no effusion, no ecchymosis, no deformity, no laceration, no erythema, normal alignment, no LCL laxity, normal  patellar mobility, no bony tenderness and no MCL laxity. No tenderness found.       Left knee: She exhibits swelling. She exhibits normal range of motion, no effusion, no ecchymosis, no deformity, no laceration, no erythema, normal alignment, no LCL laxity, normal patellar mobility, no bony tenderness, normal meniscus and no MCL laxity. No tenderness found.  No C/T/L midline tenderness. FROM of neck and back. Pt able to stand and ambulate in room. Mild swelling noted to anterior knees. Sensation intact. 2+ distal pulses.  Lymphadenopathy:    She has no cervical adenopathy.  Neurological: She is alert and oriented to person, place, and time. She has normal strength and normal reflexes. No cranial nerve deficit or sensory deficit. She displays a negative Romberg sign. Coordination normal.  Skin: Skin is warm and dry.  Nursing note and vitals reviewed.   ED Course  Procedures (including critical care  time) Labs Review Labs Reviewed - No data to display  Imaging Review No results found. I have personally reviewed and evaluated these images and lab results as part of my medical decision-making.  Filed Vitals:   07/24/15 1544  BP: 149/54  Pulse: 67  Temp:   Resp: 18      MDM   Final diagnoses:  Subdural hematoma ([place])  Fall, initial encounter  Nasal bone fracture, closed, initial encounter  Abrasion    Patient reports status post mechanical fall, denies LOC. He endorses nose abrasion, nosebleed, bilateral knee pain, right hand pain. Patient is not on any blood thinners. Exam reveals small abrasion to bridge of nose, no active bleeding, mild swelling noted to nose with TTP, no epistaxis. Mild swelling noted to bilateral knees, full range of motion, bilateral lower extremities neurovascularly intact. No neuro deficits. Head CT revealed small subdural hematoma, slightly impacted fracture of nasal bone. Consulted neurosurgery. Dr. Saintclair Halsted recommended patient to be d/c and follow up in his  clinic tomorrow morning. Discussed results and follow-up plan with patient. Patient advised to continue using ice and Tylenol for pain relief at home. Pt lives at assisted living facility and reports having nursing on call at her residence and states she will have staff keeping an eye on her over night.  Evaluation does not show pathology requring ongoing emergent intervention or admission. Pt is hemodynamically stable and mentating appropriately. Discussed findings/results and plan with patient/guardian, who agrees with plan. All questions answered. Return precautions discussed and outpatient follow up given.      Chesley Noon [place], [place] 07/24/15 2248

## 2015-07-24 NOTE — ED Provider Notes (Signed)
Medical screening examination/treatment/procedure(s) were conducted as a shared visit with non-physician practitioner(s) and myself.  I personally evaluated the patient during the encounter.   EKG Interpretation None      Results for orders placed or performed in visit on 03/05/15  Implantable device check  Result Value Ref Range   Date Time Interrogation Session [id number]    Pulse Generator Manufacturer MERM    Pulse Gen Model [id number] Adapta    Pulse Gen Serial Number [id number]    Implantable Pulse Generator Type Implantable Pulse Generator    Implantable Pulse Generator Implant Date [id number]    Lead Channel Setting Sensing Sensitivity 4.00 mV   Lead Channel Setting Pacing Amplitude 2.000 V   Lead Channel Setting Pacing Pulse Width 0.40 ms   Lead Channel Setting Pacing Amplitude 2.500 V   Lead Channel Impedance Value 448 ohm   Lead Channel Impedance Value 678 ohm   Battery Status Unknown    Battery Remaining Longevity 127 mo   Battery Voltage 2.79 V   Battery Impedance 100 ohm   Brady Statistic AP VP Percent 96 %   Brady Statistic AS VP Percent 4 %   Brady Statistic AP VS Percent 0 %   Brady Statistic AS VS Percent 0 %   Ct Head Wo Contrast  07/24/2015  CLINICAL DATA:  Head and face trauma with swelling and bruising around the eyes secondary to a fall. EXAM: CT HEAD WITHOUT CONTRAST CT MAXILLOFACIAL WITHOUT CONTRAST TECHNIQUE: Multidetector CT imaging of the head and maxillofacial structures were performed using the standard protocol without intravenous contrast. Multiplanar CT image reconstructions of the maxillofacial structures were also generated. COMPARISON:  None. FINDINGS: CT HEAD FINDINGS There is slight increased density along the left side of the tentorium cerebelli consistent with small subdural hematoma. No other intracranial hemorrhage. No acute infarction or mass lesion. Vague white matter lucency in the left posterior parietal lobe consistent with  chronic small vessel ischemic disease. Brain parenchyma otherwise appears normal. Extensive calcification in the distal vertebral arteries, basilar artery and carotid siphons. No acute osseous abnormality of the visualized portion of the skull. CT MAXILLOFACIAL FINDINGS There fractures of both sides of the nasal bone with impaction and slight displacement. There is a tiny chip fracture of the left side of the anterior maxillary spine at the inferior aspect of the nose. The other facial bones are normal. Paranasal sinuses are clear. Orbits are normal. Extensive chronic degenerative disc and joint disease in the cervical spine. C2-3 and for are fused. Severe right facet arthritis at C4-5. IMPRESSION: 1. Small subdural hematoma along the left side of the tentorium cerebelli. 2. Minimal chronic white matter ischemic changes in the left posterior parietal lobe. 3. Slightly impacted fracture of the nasal bone. 4. Tiny chip fracture of the anterior maxillary spine. Electronically Signed   By: Lorriane Shire M.D.   On: 07/24/2015 14:40   Ct Maxillofacial Wo Cm  07/24/2015  CLINICAL DATA:  Head and face trauma with swelling and bruising around the eyes secondary to a fall. EXAM: CT HEAD WITHOUT CONTRAST CT MAXILLOFACIAL WITHOUT CONTRAST TECHNIQUE: Multidetector CT imaging of the head and maxillofacial structures were performed using the standard protocol without intravenous contrast. Multiplanar CT image reconstructions of the maxillofacial structures were also generated. COMPARISON:  None. FINDINGS: CT HEAD FINDINGS There is slight increased density along the left side of the tentorium cerebelli consistent with small subdural hematoma. No other intracranial hemorrhage. No acute infarction or mass lesion. Vague white matter lucency in  the left posterior parietal lobe consistent with chronic small vessel ischemic disease. Brain parenchyma otherwise appears normal. Extensive calcification in the distal vertebral arteries,  basilar artery and carotid siphons. No acute osseous abnormality of the visualized portion of the skull. CT MAXILLOFACIAL FINDINGS There fractures of both sides of the nasal bone with impaction and slight displacement. There is a tiny chip fracture of the left side of the anterior maxillary spine at the inferior aspect of the nose. The other facial bones are normal. Paranasal sinuses are clear. Orbits are normal. Extensive chronic degenerative disc and joint disease in the cervical spine. C2-3 and for are fused. Severe right facet arthritis at C4-5. IMPRESSION: 1. Small subdural hematoma along the left side of the tentorium cerebelli. 2. Minimal chronic white matter ischemic changes in the left posterior parietal lobe. 3. Slightly impacted fracture of the nasal bone. 4. Tiny chip fracture of the anterior maxillary spine. Electronically Signed   By: Lorriane Shire M.D.   On: 07/24/2015 14:40    Patient tripped and fell landing on her face with abrasions and swelling to the nose. No significant bleeding. No neck pain no loss of consciousness. No complaint of headache. Some mild pain to both knees and right hand but they seem to be working fine. Patient is not on any blood thinners.   Head CT shows evidence of small subdural along the left side of the tentorium cerebelli. We'll discuss with neurosurgery on disposition. Patient will be referred to maxillofacial trauma for the nasal bone fracture.  Disposition is pending on neurosurgery. Otherwise patient can be discharged home.  Patient is alert and oriented follows commands fine. Swelling to the nose with abrasion no active nasal bleeding. No neck tenderness. Eyes working normal no double vision. No significant neuro focal deficit.  Fredia Sorrow, MD 07/24/15 1459

## 2015-07-24 NOTE — ED Notes (Signed)
[name] pts daughter called to check on pt. I talked with pt prior to talking with daughter to get her permission. Pt gave permission. Daughter states she will come to see pt.

## 2015-07-24 NOTE — ED Notes (Signed)
Pt tripped/fell-facial injury with abrasion to nose-pt with gauze in nostrils with no bleed thru-denies neck pain or LOC-also c/o pain to both knees and right hand

## 2015-07-24 NOTE — ED Notes (Signed)
Pa in to see patient

## 2015-07-24 NOTE — ED Notes (Signed)
Ice pack given for face.

## 2015-07-24 NOTE — ED Notes (Signed)
Pt has equal strength x 4. Pupils 3 and perrl. Pt alert and oriented x 3.

## 2015-09-20 ENCOUNTER — Other Ambulatory Visit: Payer: Self-pay | Admitting: Family Medicine

## 2015-09-20 DIAGNOSIS — M810 Age-related osteoporosis without current pathological fracture: Secondary | ICD-10-CM

## 2015-10-25 ENCOUNTER — Ambulatory Visit
Admission: RE | Admit: 2015-10-25 | Discharge: 2015-10-25 | Disposition: A | Discharge status: Home / Self Care | Payer: Medicare Other | Source: Ambulatory Visit | Attending: Family Medicine | Admitting: Family Medicine

## 2015-10-25 DIAGNOSIS — M810 Age-related osteoporosis without current pathological fracture: Secondary | ICD-10-CM

## 2016-02-28 ENCOUNTER — Other Ambulatory Visit: Payer: Self-pay

## 2016-02-28 DIAGNOSIS — Z1231 Encounter for screening mammogram for malignant neoplasm of breast: Secondary | ICD-10-CM

## 2016-04-03 ENCOUNTER — Ambulatory Visit
Admission: RE | Admit: 2016-04-03 | Discharge: 2016-04-03 | Disposition: A | Discharge status: Home / Self Care | Payer: Medicare Other | Source: Ambulatory Visit

## 2016-04-03 DIAGNOSIS — Z1231 Encounter for screening mammogram for malignant neoplasm of breast: Secondary | ICD-10-CM

## 2016-08-11 ENCOUNTER — Other Ambulatory Visit: Payer: Self-pay | Admitting: Family Medicine

## 2016-08-11 DIAGNOSIS — I739 Peripheral vascular disease, unspecified: Secondary | ICD-10-CM

## 2016-08-18 ENCOUNTER — Ambulatory Visit
Admission: RE | Admit: 2016-08-18 | Discharge: 2016-08-18 | Disposition: A | Discharge status: Home / Self Care | Payer: Medicare Other | Source: Ambulatory Visit | Attending: Family Medicine | Admitting: Family Medicine

## 2016-08-18 DIAGNOSIS — I739 Peripheral vascular disease, unspecified: Secondary | ICD-10-CM

## 2016-08-26 ENCOUNTER — Other Ambulatory Visit: Payer: Self-pay | Admitting: *Deleted

## 2016-08-26 DIAGNOSIS — I739 Peripheral vascular disease, unspecified: Secondary | ICD-10-CM

## 2016-09-16 ENCOUNTER — Encounter: Payer: Self-pay | Admitting: Surgery

## 2016-09-21 ENCOUNTER — Ambulatory Visit (HOSPITAL_COMMUNITY)
Admission: RE | Admit: 2016-09-21 | Discharge: 2016-09-21 | Disposition: A | Discharge status: Home / Self Care | Payer: Medicare Other | Source: Ambulatory Visit | Attending: Vascular Surgery | Admitting: Vascular Surgery

## 2016-09-21 DIAGNOSIS — I739 Peripheral vascular disease, unspecified: Secondary | ICD-10-CM | POA: Diagnosis not present

## 2016-09-25 ENCOUNTER — Encounter: Payer: Self-pay | Admitting: Surgery

## 2016-09-25 ENCOUNTER — Ambulatory Visit (INDEPENDENT_AMBULATORY_CARE_PROVIDER_SITE_OTHER): Payer: Medicare Other | Admitting: Surgery

## 2016-09-25 VITALS — BP 137/71 | HR 64 | Temp 97.1°F | Resp 18 | Ht 62.0 in | Wt 139.0 lb

## 2016-09-25 DIAGNOSIS — I739 Peripheral vascular disease, unspecified: Secondary | ICD-10-CM | POA: Diagnosis not present

## 2016-09-25 DIAGNOSIS — I70212 Atherosclerosis of native arteries of extremities with intermittent claudication, left leg: Secondary | ICD-10-CM

## 2016-09-25 MED ORDER — CILOSTAZOL 100 MG PO TABS: 100.0000 mg | ORAL_TABLET | Freq: Two times a day (BID) | ORAL | 11 refills | Status: DC

## 2016-09-25 NOTE — Progress Notes (Signed)
[facility] [place]  Patient name: [name] MRN: [id number] DOB: [birth year] Sex: female  REFERRING PHYSICIAN: Dr.  Drema Dallas  REASON FOR CONSULT: claudication  HPI: [name] is a 82 y.o. female, who is referred today for evaluation of claudication.  The patient states that she has left calf pain at approximately 10 minutes of walking that does improve with rest.  She underwent ultrasound evaluation a [place] [facility] which revealed an ABI of 1.1 on the right and 0.98 on the left.  She did have triphasic dorsalis pedis Doppler signal on the left with monophasic posterior tibial signal.  The patient also reports that with minimal activity she gets pain in her bilateral hips and lower back which occasionally can radiate down both legs.  She has a history of hip replacement in the past.  She has been told that this is functioning properly.  She does exercise regularly.  The patient suffers from hypercholesterolemia and is on a statin.  She is a nonsmoker.  She takes an ARB for hypertension  Past Medical History:  Diagnosis Date  . Anemia   . Arthritis   . Breast cancer ([place])   . Chronic kidney disease   . Complete heart block (HCC)    a. s/p MDT dual chamber pacemaker Dr Rayann Heman  . Hyperlipidemia   . Hypertension   . Osteoporosis   . Peripheral vascular disease ([place])   . Vertigo     Family History  Problem Relation Age of Onset  . Hypertension Mother     possibly per pt  . Hyperlipidemia Mother     possibly per pt  . Transient ischemic attack Mother     multiple  . CVA Mother   . Alcoholism Father   . Other Father 67    cerebral hemorrhage  . Cirrhosis Brother   . Alcoholism Brother   . Cancer Maternal Aunt     lung  . Cancer Sister     breast  . Cancer Sister     breast    SOCIAL HISTORY: Social History   Social History  . Marital status: Divorced    Spouse name: N/A  . Number of children:  N/A  . Years of education: N/A   Occupational History  . Not on file.   Social History Main Topics  . Smoking status: Never Smoker  . Smokeless tobacco: Never Used  . Alcohol use Yes     Comment: rarely  . Drug use: No  . Sexual activity: Not on file   Other Topics Concern  . Not on file   Social History Narrative  . No narrative on file    Allergies  Allergen Reactions  . Molds & Smuts Hives    Current Outpatient Prescriptions  Medication Sig Dispense Refill  . acetaminophen (TYLENOL) 325 MG tablet Take 650 mg by mouth every 6 (six) hours as needed.    [place] Kitchen atorvastatin (LIPITOR) 10 MG tablet Take 10 mg by mouth daily.    . Calcium Carb-Cholecalciferol (CALCIUM 600 + D PO) Take 1 tablet by mouth daily.    . cetirizine (ZYRTEC) 10 MG tablet Take 10 mg by mouth daily.    Mar[place] Kitchen losartan (COZAAR) 50 MG tablet Take 25 mg by mouth at bedtime.     . magnesium gluconate (MAGONATE) 500 MG tablet Take 500 mg by mouth 2 (two) times daily.    Mar[place] Kitchen MELATONIN PO Take 1 capsule by mouth at bedtime as needed (sleep).    Mar[place] Kitchen  METOPROLOL TARTRATE PO Take by mouth.    . Probiotic Product (PROBIOTIC DAILY PO) Take 1 capsule by mouth daily.    . traMADol (ULTRAM) 50 MG tablet Take 50-100 mg by mouth every 6 (six) hours as needed (pain).    [place] Kitchen atenolol (TENORMIN) 50 MG tablet Take 25 mg by mouth every evening.    . Fish Oil-Cholecalciferol (FISH OIL + D3) 1000-1000 MG-UNIT CAPS Take 1 capsule by mouth daily.    . Glucosamine-Chondroitin 250-200 MG TABS Take 1 tablet by mouth daily.    [place] Kitchen HYDROcodone-acetaminophen (NORCO/VICODIN) 5-325 MG per tablet Take 1 tablet by mouth every 6 (six) hours as needed for moderate pain.    [place] Kitchen loratadine (CLARITIN) 10 MG tablet Take 10 mg by mouth daily.    . methocarbamol (ROBAXIN) 500 MG tablet Take 500 mg by mouth every 6 (six) hours as needed for muscle spasms.    . metoprolol-hydrochlorothiazide (LOPRESSOR HCT) 100-25 MG tablet Take 1 tablet by mouth daily.    .  simvastatin (ZOCOR) 20 MG tablet Take 10 mg by mouth daily.     No current facility-administered medications for this visit.     REVIEW OF SYSTEMS:  [X]  denotes positive finding, [ ]  denotes negative finding Cardiac  Comments:  Chest pain or chest pressure:    Shortness of breath upon exertion:    Short of breath when lying flat:    Irregular heart rhythm:        Vascular    Pain in calf, thigh, or hip brought on by ambulation: x   Pain in feet at night that wakes you up from your sleep:  x   Blood clot in your veins:    Leg swelling:         Pulmonary    Oxygen at home:    Productive cough:     Wheezing:         Neurologic    Sudden weakness in arms or legs:     Sudden numbness in arms or legs:     Sudden onset of difficulty speaking or slurred speech:    Temporary loss of vision in one eye:     Problems with dizziness:         Gastrointestinal    Blood in stool:     Vomited blood:         Genitourinary    Burning when urinating:     Blood in urine:        Psychiatric    Major depression:         Hematologic    Bleeding problems:    Problems with blood clotting too easily:        Skin    Rashes or ulcers:        Constitutional    Fever or chills:      PHYSICAL EXAM: Vitals:   09/25/16 0956 09/25/16 1001  BP: (!) 147/78 137/71  Pulse: 64   Resp: 18   Temp: 97.1 F (36.2 C)   TempSrc: Oral   SpO2: 98%   Weight: 139 lb (63 kg)   Height: 5\' 2"  (1.575 m)     GENERAL: The patient is a well-nourished female, in no acute distress. The vital signs are documented above. CARDIAC: There is a regular rate and rhythm.  VASCULAR: Palpable dorsalis pedis pulse bilaterally.  No carotid bruits. PULMONARY: Nonlabored respirations MUSCULOSKELETAL: There are no major deformities or cyanosis. NEUROLOGIC: No focal weakness or paresthesias are detected. SKIN: There  are no ulcers or rashes noted. PSYCHIATRIC: The patient has a normal affect.  DATA:  I have  reviewed her ultrasound.  ABI on the right is 1.14.  On the left is 0.98.  Toe pressure on the right is 87.  There was a triphasic Doppler signal in the dorsalis pedis artery on the left and monophasic on the posterior tibial  ASSESSMENT AND PLAN: Left leg pain: I discussed with the patient that the symptoms she is having in her hip and lower back and radiating down both legs is not related to vascular insufficiency.  The only possible leg problems she is having that could be vascular related is the cramping in her left calf that she gets with after 10 minutes of walking.  I have discussed with her that I am not convinced that this is vascular disease.  Ultrasound showed a triphasic dorsalis pedis Doppler signal.  Her ABIs are normal with a palpable pulse.  I did discuss with her and her son that I would treat her for vascular disease to see if she gets any improvement.  We will start with an exercise program.  She is also given a prescription for cilostazol.  I will have her follow-up with me in 3 months with a duplex.   Annamarie Major, MD [facility] [phone] Pager [phone]

## 2016-09-29 ENCOUNTER — Encounter: Payer: Self-pay | Admitting: Family Medicine

## 2016-09-29 NOTE — Addendum Note (Signed)
Addended by: Lianne Cure A on: 09/29/2016 12:09 PM   Modules accepted: Orders

## 2016-10-14 ENCOUNTER — Encounter (HOSPITAL_COMMUNITY): Payer: Medicare Other

## 2016-10-14 ENCOUNTER — Encounter: Payer: Medicare Other | Admitting: Vascular Surgery

## 2016-12-18 ENCOUNTER — Encounter: Payer: Self-pay | Admitting: Surgery

## 2016-12-28 ENCOUNTER — Ambulatory Visit (HOSPITAL_COMMUNITY)
Admission: RE | Admit: 2016-12-28 | Discharge: 2016-12-28 | Disposition: A | Discharge status: Home / Self Care | Payer: Medicare Other | Source: Ambulatory Visit | Attending: Surgery | Admitting: Surgery

## 2016-12-28 ENCOUNTER — Ambulatory Visit (INDEPENDENT_AMBULATORY_CARE_PROVIDER_SITE_OTHER): Payer: Medicare Other | Admitting: Surgery

## 2016-12-28 ENCOUNTER — Ambulatory Visit (INDEPENDENT_AMBULATORY_CARE_PROVIDER_SITE_OTHER)
Admission: RE | Admit: 2016-12-28 | Discharge: 2016-12-28 | Disposition: A | Discharge status: Home / Self Care | Payer: Medicare Other | Source: Ambulatory Visit | Attending: Surgery | Admitting: Surgery

## 2016-12-28 ENCOUNTER — Encounter: Payer: Self-pay | Admitting: Surgery

## 2016-12-28 VITALS — BP 120/77 | HR 65 | Temp 97.2°F | Resp 16 | Ht 62.0 in | Wt 137.0 lb

## 2016-12-28 DIAGNOSIS — I70212 Atherosclerosis of native arteries of extremities with intermittent claudication, left leg: Secondary | ICD-10-CM

## 2016-12-28 DIAGNOSIS — E785 Hyperlipidemia, unspecified: Secondary | ICD-10-CM | POA: Insufficient documentation

## 2016-12-28 DIAGNOSIS — I739 Peripheral vascular disease, unspecified: Secondary | ICD-10-CM

## 2016-12-28 DIAGNOSIS — I10 Essential (primary) hypertension: Secondary | ICD-10-CM | POA: Insufficient documentation

## 2016-12-28 DIAGNOSIS — R0989 Other specified symptoms and signs involving the circulatory and respiratory systems: Secondary | ICD-10-CM | POA: Diagnosis present

## 2016-12-28 NOTE — Progress Notes (Signed)
V[facility] and Vein Specialist of [place]  Patient name: MEEYAH OVITT MRN: [id number] DOB: 1929/06/30 Sex: female   REASON FOR VISIT:    Follow up  [place]:    VERTIE DIBBERN is a 83 y.o. female who I met in late 2017 for evaluation of left calf pain.  She states that her pain begins after walking approximately 10 minutes.  It does improve with rest.  She had undergone ultrasound evaluation which revealed normal ankle-brachial indices.  She did have some waveform changes in her left leg which raises suspicion that this could potentially be a vascular issue.  I elected to treat her medically.  She was given a prescription of cilostazol which she did not take because she was afraid of diarrhea.  She tried to improve her walking distance.  She states that her left leg does not bother her.  Her pain is now mainly in her left hip.   PAST MEDICAL HISTORY:   Past Medical History:  Diagnosis Date  . Anemia   . Arthritis   . Breast cancer ([place])   . Chronic kidney disease   . Complete heart block (HCC)    a. s/p MDT dual chamber pacemaker Dr Rayann Heman  . Hyperlipidemia   . Hypertension   . Osteoporosis   . Peripheral vascular disease ([place])   . Vertigo      FAMILY HISTORY:   Family History  Problem Relation Age of Onset  . Hypertension Mother     possibly per pt  . Hyperlipidemia Mother     possibly per pt  . Transient ischemic attack Mother     multiple  . CVA Mother   . Alcoholism Father   . Other Father 5    cerebral hemorrhage  . Cirrhosis Brother   . Alcoholism Brother   . Cancer Maternal Aunt     lung  . Cancer Sister     breast  . Cancer Sister     breast    SOCIAL HISTORY:   Social History  Substance Use Topics  . Smoking status: Never Smoker  . Smokeless tobacco: Never Used  . Alcohol use Yes     Comment: rarely     ALLERGIES:   Allergies  Allergen Reactions  . Molds & Smuts Hives      CURRENT MEDICATIONS:   Current Outpatient Prescriptions  Medication Sig Dispense Refill  . acetaminophen (TYLENOL) 325 MG tablet Take 650 mg by mouth every 6 (six) hours as needed.    Marland Kitchen atorvastatin (LIPITOR) 10 MG tablet Take 10 mg by mouth daily.    . Calcium Carb-Cholecalciferol (CALCIUM 600 + D PO) Take 1 tablet by mouth daily.    . cetirizine (ZYRTEC) 10 MG tablet Take 10 mg by mouth daily.    . cilostazol (PLETAL) 100 MG tablet Take 1 tablet (100 mg total) by mouth 2 (two) times daily before a meal. 60 tablet 11  . Fish Oil-Cholecalciferol (FISH OIL + D3) 1000-1000 MG-UNIT CAPS Take 1 capsule by mouth daily.    Marland Kitchen loratadine (CLARITIN) 10 MG tablet Take 10 mg by mouth daily.    Marland Kitchen losartan (COZAAR) 50 MG tablet Take 25 mg by mouth at bedtime.     . magnesium gluconate (MAGONATE) 500 MG tablet Take 500 mg by mouth 2 (two) times daily.    Marland Kitchen MELATONIN PO Take 1 capsule by mouth at bedtime as needed (sleep).    . methocarbamol (ROBAXIN) 500 MG tablet Take 500  mg by mouth every 6 (six) hours as needed for muscle spasms.    Marland Kitchen METOPROLOL TARTRATE PO Take by mouth.    . metoprolol-hydrochlorothiazide (LOPRESSOR HCT) 100-25 MG tablet Take 1 tablet by mouth daily.    . Probiotic Product (PROBIOTIC DAILY PO) Take 1 capsule by mouth daily.    . traMADol (ULTRAM) 50 MG tablet Take 50-100 mg by mouth every 6 (six) hours as needed (pain).     No current facility-administered medications for this visit.     REVIEW OF SYSTEMS:   [X] denotes positive finding, [ ] denotes negative finding Cardiac  Comments:  Chest pain or chest pressure:    Shortness of breath upon exertion:    Short of breath when lying flat:    Irregular heart rhythm:        Vascular    Pain in calf, thigh, or hip brought on by ambulation: x   Pain in feet at night that wakes you up from your sleep:     Blood clot in your veins:    Leg swelling:         Pulmonary    Oxygen at home:    Productive cough:      Wheezing:         Neurologic    Sudden weakness in arms or legs:     Sudden numbness in arms or legs:     Sudden onset of difficulty speaking or slurred speech:    Temporary loss of vision in one eye:     Problems with dizziness:         Gastrointestinal    Blood in stool:     Vomited blood:         Genitourinary    Burning when urinating:     Blood in urine:        Psychiatric    Major depression:         Hematologic    Bleeding problems:    Problems with blood clotting too easily:        Skin    Rashes or ulcers:        Constitutional    Fever or chills:      PHYSICAL EXAM:   Vitals:   12/28/16 1222  BP: 120/77  Pulse: 65  Resp: 16  Temp: 97.2 F (36.2 C)  TempSrc: Oral  SpO2: 97%  Weight: 137 lb (62.1 kg)  Height: 5' 2" (1.575 m)    GENERAL: The patient is a well-nourished female, in no acute distress. The vital signs are documented above. CARDIAC: There is a regular rate and rhythm.  VASCULAR: I cannot palpate pedal pulses today PULMONARY: Non-labored respirations MUSCULOSKELETAL: There are no major deformities or cyanosis. NEUROLOGIC: No focal weakness or paresthesias are detected. SKIN: There are no ulcers or rashes noted. PSYCHIATRIC: The patient has a normal affect.  STUDIES:   ABIs were done today which showed triphasic waveforms bilaterally and ABIs greater than 1.  Toe pressures are 114 on the right and 118 on the left  MEDICAL ISSUES:   Based on the patient's symptoms as well as repeat lab values today, I do not think that she has any significant vascular disease that warrants intervention or further evaluation.  I suspect most of her discomfort in her leg is now coming from her hip.  She is going to contact her surgeon for a follow-up evaluation.  I have scheduled the patient for follow-up in 1 year with repeat ABIs to  see if there has been any progression.    Annamarie Major, MD [facility] [phone] Pager [phone]

## 2016-12-29 NOTE — Addendum Note (Signed)
Addended by: Lianne Cure A on: 12/29/2016 01:48 PM   Modules accepted: Orders

## 2017-02-09 ENCOUNTER — Ambulatory Visit: Payer: Medicare Other

## 2017-02-09 ENCOUNTER — Ambulatory Visit (INDEPENDENT_AMBULATORY_CARE_PROVIDER_SITE_OTHER): Payer: Medicare Other | Admitting: Sports Medicine

## 2017-02-09 DIAGNOSIS — M79675 Pain in left toe(s): Secondary | ICD-10-CM | POA: Diagnosis not present

## 2017-02-09 DIAGNOSIS — M1000 Idiopathic gout, unspecified site: Secondary | ICD-10-CM | POA: Diagnosis not present

## 2017-02-09 NOTE — Patient Instructions (Signed)

## 2017-02-09 NOTE — Progress Notes (Signed)
Subjective: [name] is a 83 y.o. female patient who presents to office for evaluation of Left foot pain. Patient states that pain is better had a flare up at 2nd toe that she wanted to have check; happened once before; got better after rest and soaking. Denies any acute issues.   Patient Active Problem List   Diagnosis Date Noted  . Cardiac pacemaker in situ   . Complete heart block ([place]) 11/12/2014  . Hypertension 11/12/2014  . Hypercholesterolemia 11/12/2014  . Kidney disease, chronic, stage III (GFR 30-59 ml/min)     Current Outpatient Prescriptions on File Prior to Visit  Medication Sig Dispense Refill  . acetaminophen (TYLENOL) 325 MG tablet Take 650 mg by mouth every 6 (six) hours as needed.    Marland Kitchen atorvastatin (LIPITOR) 10 MG tablet Take 10 mg by mouth daily.    . Calcium Carb-Cholecalciferol (CALCIUM 600 + D PO) Take 1 tablet by mouth daily.    . cetirizine (ZYRTEC) 10 MG tablet Take 10 mg by mouth daily.    . cilostazol (PLETAL) 100 MG tablet Take 1 tablet (100 mg total) by mouth 2 (two) times daily before a meal. 60 tablet 11  . Fish Oil-Cholecalciferol (FISH OIL + D3) 1000-1000 MG-UNIT CAPS Take 1 capsule by mouth daily.    Marland Kitchen loratadine (CLARITIN) 10 MG tablet Take 10 mg by mouth daily.    Marland Kitchen losartan (COZAAR) 50 MG tablet Take 25 mg by mouth at bedtime.     . magnesium gluconate (MAGONATE) 500 MG tablet Take 500 mg by mouth 2 (two) times daily.    Marland Kitchen MELATONIN PO Take 1 capsule by mouth at bedtime as needed (sleep).    . methocarbamol (ROBAXIN) 500 MG tablet Take 500 mg by mouth every 6 (six) hours as needed for muscle spasms.    Marland Kitchen METOPROLOL TARTRATE PO Take by mouth.    . metoprolol-hydrochlorothiazide (LOPRESSOR HCT) 100-25 MG tablet Take 1 tablet by mouth daily.    . Probiotic Product (PROBIOTIC DAILY PO) Take 1 capsule by mouth daily.    . traMADol (ULTRAM) 50 MG tablet Take 50-100 mg by mouth every 6 (six) hours as needed (pain).     No current  facility-administered medications on file prior to visit.     Allergies  Allergen Reactions  . Molds & Smuts Hives    Objective:  General: Alert and oriented x3 in no acute distress  Dermatology: No open lesions bilateral lower extremities, no webspace macerations, no ecchymosis bilateral, all nails x 10 are well manicured.  Vascular: Dorsalis Pedis and Posterior Tibial pedal pulses palpable, Capillary Fill Time 5 seconds,(-) pedal hair growth bilateral, no edema bilateral lower extremities, Temperature gradient within normal limits.  Neurology: Gross sensation intact via light touch bilateral. (- )Tinels sign bilateral.   Musculoskeletal: No tenderness especially at left 2nd toe. + Hammertoe. Strength within normal limits in all groups bilateral.   Left foot xrays- no acute findings, chronic hammertoe and old surgical changes.   Assessment and Plan: Problem List Items Addressed This Visit    None    Visit Diagnoses    Toe pain, left    -  Primary   Relevant Orders   DG Foot Complete Left   Idiopathic gout, unspecified chronicity, unspecified site       Resolved        -Complete examination performed -Xrays reviewed -Discussed treatement options for possible gout at toe however hard to characterize because patient is no longer symptomatic -Rx  gout education -Gave toe protector -Advised monitoring and to return to office if problematic -Patient to return to office as needed or sooner if condition worsens.  Landis Martins, DPM

## 2017-03-02 ENCOUNTER — Other Ambulatory Visit: Payer: Self-pay | Admitting: Family Medicine

## 2017-03-02 DIAGNOSIS — Z1231 Encounter for screening mammogram for malignant neoplasm of breast: Secondary | ICD-10-CM

## 2017-04-05 ENCOUNTER — Ambulatory Visit
Admission: RE | Admit: 2017-04-05 | Discharge: 2017-04-05 | Disposition: A | Discharge status: Home / Self Care | Payer: Medicare Other | Source: Ambulatory Visit | Attending: Family Medicine | Admitting: Family Medicine

## 2017-04-05 DIAGNOSIS — Z1231 Encounter for screening mammogram for malignant neoplasm of breast: Secondary | ICD-10-CM

## 2018-01-03 ENCOUNTER — Encounter (HOSPITAL_COMMUNITY): Payer: Medicare Other

## 2018-01-03 ENCOUNTER — Ambulatory Visit: Payer: Medicare Other

## 2018-03-02 ENCOUNTER — Other Ambulatory Visit: Payer: Self-pay | Admitting: Family Medicine

## 2018-03-02 DIAGNOSIS — Z1231 Encounter for screening mammogram for malignant neoplasm of breast: Secondary | ICD-10-CM

## 2018-03-22 ENCOUNTER — Encounter: Payer: Self-pay | Admitting: Cardiology

## 2018-03-28 ENCOUNTER — Encounter: Payer: Self-pay | Admitting: Cardiology

## 2018-03-28 DIAGNOSIS — I209 Angina pectoris, unspecified: Secondary | ICD-10-CM

## 2018-04-11 ENCOUNTER — Ambulatory Visit
Admission: RE | Admit: 2018-04-11 | Discharge: 2018-04-11 | Disposition: A | Discharge status: Home / Self Care | Payer: Medicare Other | Source: Ambulatory Visit | Attending: Family Medicine | Admitting: Family Medicine

## 2018-04-11 ENCOUNTER — Ambulatory Visit: Payer: Medicare Other

## 2018-04-11 DIAGNOSIS — Z1231 Encounter for screening mammogram for malignant neoplasm of breast: Secondary | ICD-10-CM

## 2018-04-12 ENCOUNTER — Other Ambulatory Visit: Payer: Self-pay | Admitting: Family Medicine

## 2018-04-12 DIAGNOSIS — R921 Mammographic calcification found on diagnostic imaging of breast: Secondary | ICD-10-CM

## 2018-04-15 ENCOUNTER — Ambulatory Visit
Admission: RE | Admit: 2018-04-15 | Discharge: 2018-04-15 | Disposition: A | Discharge status: Home / Self Care | Payer: Medicare Other | Source: Ambulatory Visit | Attending: Family Medicine | Admitting: Family Medicine

## 2018-04-15 ENCOUNTER — Other Ambulatory Visit: Payer: Self-pay | Admitting: Family Medicine

## 2018-04-15 DIAGNOSIS — R921 Mammographic calcification found on diagnostic imaging of breast: Secondary | ICD-10-CM

## 2018-04-15 HISTORY — DX: Personal history of irradiation: Z92.3

## 2018-04-19 ENCOUNTER — Ambulatory Visit
Admission: RE | Admit: 2018-04-19 | Discharge: 2018-04-19 | Disposition: A | Discharge status: Home / Self Care | Payer: Medicare Other | Source: Ambulatory Visit | Attending: Family Medicine | Admitting: Family Medicine

## 2018-04-19 DIAGNOSIS — R921 Mammographic calcification found on diagnostic imaging of breast: Secondary | ICD-10-CM

## 2018-04-28 ENCOUNTER — Encounter: Payer: Self-pay | Admitting: Cardiology

## 2018-04-28 ENCOUNTER — Ambulatory Visit: Payer: Self-pay | Admitting: General Surgery

## 2018-04-28 DIAGNOSIS — D0512 Intraductal carcinoma in situ of left breast: Secondary | ICD-10-CM

## 2018-05-04 ENCOUNTER — Telehealth: Payer: Self-pay | Admitting: Oncology

## 2018-05-04 ENCOUNTER — Encounter: Payer: Self-pay | Admitting: Oncology

## 2018-05-04 NOTE — Telephone Encounter (Signed)
New referral received from Dr. Excell Seltzer at [facility] for breast cancer. Pt has been scheduled to see Dr. Jana Hakim on 8/5 at 4pm. Pt aware to arrive 30 minutes early for labs. Letter mailed.

## 2018-05-06 ENCOUNTER — Other Ambulatory Visit: Payer: Self-pay | Admitting: General Surgery

## 2018-05-06 DIAGNOSIS — D0512 Intraductal carcinoma in situ of left breast: Secondary | ICD-10-CM

## 2018-05-10 NOTE — Pre-Procedure Instructions (Signed)
NATHALYA WOLANSKI  05/10/2018      [place] [address], [place] [address] [place] [address] Phone: [phone] Fax: [phone]  [facility] - [place], [place] - [address] [place] [address] Phone: [phone] Fax: [phone]  [place] [facility] [address] [place] [address] [place] [address] Phone: [phone] Fax: [phone]    Your procedure is scheduled on Aug. 5  Report to [facility] Admitting at 10:30 A.M.  Call this number if you have problems the morning of surgery:  [phone]   Remember:    Do not eat or drink after midnight.                    Take these medicines the morning of surgery with A SIP OF WATER :                Fexofenadine (allegra)              Eye drops if needed              Metoprolol (toprol--xl)              Nitroglycerine if needed                7 days prior to surgery STOP taking  Aleve, Naproxen, Ibuprofen, Motrin, Advil, Goody's, BC's, all herbal medications, fish oil, and all vitamins                Follow your surgeon's instructions on when to stop Asprin and plavix.  If no instructions were given by your surgeon then you will need to call the office to get those instructions.        Do not wear jewelry, make-up or nail polish.  Do not wear lotions, powders, or perfumes, or deodorant.  Do not shave 48 hours prior to surgery.  Men may shave face and neck.  Do not bring valuables to the hospital.  [facility] is not responsible for any belongings or valuables.  Contacts, dentures or bridgework may not be worn into surgery.  Leave your suitcase in the car.  After surgery it may be brought to your room.  For patients admitted to the hospital, discharge time will be determined by your treatment team.  Patients discharged the day of surgery will not be allowed to drive  home.    Special instructions:  [facility]- Preparing For Surgery  Before surgery, you can play an important role. Because skin is not sterile, your skin needs to be as free of germs as possible. You can reduce the number of germs on your skin by washing with CHG (chlorahexidine gluconate) Soap before surgery.  CHG is an antiseptic cleaner which kills germs and bonds with the skin to continue killing germs even after washing.    Oral Hygiene is also important to reduce your risk of infection.  Remember - BRUSH YOUR TEETH THE MORNING OF SURGERY WITH YOUR REGULAR TOOTHPASTE  Please do not use if you have an allergy to CHG or antibacterial soaps. If your skin becomes reddened/irritated stop using the CHG.  Do not shave (including legs and underarms) for at least 48 hours prior to first CHG shower. It is OK to shave your face.  Please follow these instructions carefully.   1. Shower the [employer] BEFORE SURGERY  and the MORNING OF SURGERY with CHG.   2. If you chose to wash your hair, wash your hair first as usual with your normal shampoo.  3. After you shampoo, rinse your hair and body thoroughly to remove the shampoo.  4. Use CHG as you would any other liquid soap. You can apply CHG directly to the skin and wash gently with a scrungie or a clean washcloth.   5. Apply the CHG Soap to your body ONLY FROM THE NECK DOWN.  Do not use on open wounds or open sores. Avoid contact with your eyes, ears, mouth and genitals (private parts). Wash Face and genitals (private parts)  with your normal soap.  6. Wash thoroughly, paying special attention to the area where your surgery will be performed.  7. Thoroughly rinse your body with warm water from the neck down.  8. DO NOT shower/wash with your normal soap after using and rinsing off the CHG Soap.  9. Pat yourself dry with a CLEAN TOWEL.  10. Wear CLEAN PAJAMAS to bed the night before surgery, wear comfortable clothes the morning of  surgery  11. Place CLEAN SHEETS on your bed the night of your first shower and DO NOT SLEEP WITH PETS.    Day of Surgery:  Do not apply any deodorants/lotions.  Please wear clean clothes to the hospital/surgery center.   Remember to brush your teeth WITH YOUR REGULAR TOOTHPASTE.    Please read over the following fact sheets that you were given. Coughing and Deep Breathing and Surgical Site Infection Prevention

## 2018-05-11 ENCOUNTER — Other Ambulatory Visit: Payer: Self-pay

## 2018-05-11 ENCOUNTER — Encounter (HOSPITAL_COMMUNITY)
Admission: RE | Admit: 2018-05-11 | Discharge: 2018-05-11 | Disposition: A | Discharge status: Home / Self Care | Payer: Medicare Other | Source: Ambulatory Visit | Attending: General Surgery | Admitting: General Surgery

## 2018-05-11 ENCOUNTER — Encounter (HOSPITAL_COMMUNITY): Payer: Self-pay

## 2018-05-11 DIAGNOSIS — N189 Chronic kidney disease, unspecified: Secondary | ICD-10-CM | POA: Diagnosis not present

## 2018-05-11 DIAGNOSIS — Z79899 Other long term (current) drug therapy: Secondary | ICD-10-CM | POA: Diagnosis not present

## 2018-05-11 DIAGNOSIS — Z96651 Presence of right artificial knee joint: Secondary | ICD-10-CM | POA: Insufficient documentation

## 2018-05-11 DIAGNOSIS — Z7982 Long term (current) use of aspirin: Secondary | ICD-10-CM | POA: Insufficient documentation

## 2018-05-11 DIAGNOSIS — Z950 Presence of cardiac pacemaker: Secondary | ICD-10-CM | POA: Insufficient documentation

## 2018-05-11 DIAGNOSIS — Z01818 Encounter for other preprocedural examination: Secondary | ICD-10-CM | POA: Diagnosis not present

## 2018-05-11 DIAGNOSIS — E785 Hyperlipidemia, unspecified: Secondary | ICD-10-CM | POA: Insufficient documentation

## 2018-05-11 DIAGNOSIS — I739 Peripheral vascular disease, unspecified: Secondary | ICD-10-CM | POA: Insufficient documentation

## 2018-05-11 DIAGNOSIS — Z90710 Acquired absence of both cervix and uterus: Secondary | ICD-10-CM | POA: Diagnosis not present

## 2018-05-11 DIAGNOSIS — M1990 Unspecified osteoarthritis, unspecified site: Secondary | ICD-10-CM | POA: Insufficient documentation

## 2018-05-11 DIAGNOSIS — I129 Hypertensive chronic kidney disease with stage 1 through stage 4 chronic kidney disease, or unspecified chronic kidney disease: Secondary | ICD-10-CM | POA: Insufficient documentation

## 2018-05-11 DIAGNOSIS — M810 Age-related osteoporosis without current pathological fracture: Secondary | ICD-10-CM | POA: Diagnosis not present

## 2018-05-11 DIAGNOSIS — D0512 Intraductal carcinoma in situ of left breast: Secondary | ICD-10-CM | POA: Diagnosis not present

## 2018-05-11 DIAGNOSIS — Z923 Personal history of irradiation: Secondary | ICD-10-CM | POA: Diagnosis not present

## 2018-05-11 DIAGNOSIS — Z01812 Encounter for preprocedural laboratory examination: Secondary | ICD-10-CM | POA: Diagnosis present

## 2018-05-11 DIAGNOSIS — Z96643 Presence of artificial hip joint, bilateral: Secondary | ICD-10-CM | POA: Insufficient documentation

## 2018-05-11 DIAGNOSIS — Z7902 Long term (current) use of antithrombotics/antiplatelets: Secondary | ICD-10-CM | POA: Diagnosis not present

## 2018-05-11 DIAGNOSIS — I442 Atrioventricular block, complete: Secondary | ICD-10-CM | POA: Insufficient documentation

## 2018-05-11 HISTORY — DX: Angina pectoris, unspecified: I20.9

## 2018-05-11 HISTORY — DX: Presence of cardiac pacemaker: Z95.0

## 2018-05-11 LAB — CBC
HEMATOCRIT: 37.5 % (ref 36.0–46.0)
HEMOGLOBIN: 12 g/dL (ref 12.0–15.0)
MCH: 32.4 pg (ref 26.0–34.0)
MCHC: 32 g/dL (ref 30.0–36.0)
MCV: 101.4 fL — AB (ref 78.0–100.0)
Platelets: 280 10*3/uL (ref 150–400)
RBC: 3.7 MIL/uL — AB (ref 3.87–5.11)
RDW: 12 % (ref 11.5–15.5)
WBC: 6.5 10*3/uL (ref 4.0–10.5)

## 2018-05-11 LAB — BASIC METABOLIC PANEL
Anion gap: 7 (ref 5–15)
BUN: 20 mg/dL (ref 8–23)
CHLORIDE: 103 mmol/L (ref 98–111)
CO2: 28 mmol/L (ref 22–32)
Calcium: 9.8 mg/dL (ref 8.9–10.3)
Creatinine, Ser: 1.34 mg/dL — ABNORMAL HIGH (ref 0.44–1.00)
GFR calc Af Amer: 40 mL/min — ABNORMAL LOW (ref >60)
GFR calc non Af Amer: 35 mL/min — ABNORMAL LOW (ref >60)
Glucose, Bld: 102 mg/dL — ABNORMAL HIGH (ref 70–99)
POTASSIUM: 4.4 mmol/L (ref 3.5–5.1)
SODIUM: 138 mmol/L (ref 135–145)

## 2018-05-11 NOTE — Progress Notes (Addendum)
PCP: Dr. Chales Salmon Cardiologist: Dr. Ritta Slot records and faxed device sheet  Pt. Has pacemaker-[employer]tronic. E-mail sent to [employer]and to Lindsi Forte,R.N. Informing them of date/time of surgery.  Pt. Reported 5 weeks ago had 2 episodes of chest pain on different days.she was sitting when it occurred. The pain was under left breast and moved to her back. Several days later she saw Dr. Wynonia Lawman. Reported he has done several ekg's and an echo. He also put her on plavix.States he has cleared her for surgery. Garen Grams, PA notified. Will request studies from Dr. Thurman Coyer office. Pt. Reports she hasn't had any chest pain since.  Pt. Stated last dose of plavix and aspirin was 05/09/18 per Dr. Excell Seltzer.

## 2018-05-12 NOTE — Progress Notes (Signed)
Anesthesia Chart Review:  Case:  [id number] Date/Time:  05/16/18 1215   Procedure:  BREAST LUMPECTOMY WITH RADIOACTIVE SEED LOCALIZATION (Left Breast)   Anesthesia type:  General   Pre-op diagnosis:  LEFT BREAST DUCTAL CARCINOMA IN SITU   Location:  [facility] OR ROOM 02 / [place] OR   Surgeon:  Excell Seltzer, MD      DISCUSSION: 84 yo female never smoker for above procedure. Pertinent hx includes HTN, Complete heart block s/p Medtronic dual chamber pacemaker, Vertigo, PVD, Breast CA s/p lumpectomy and radiation, CKD, Anemia, Anginal pain, Arthritis.  Pt has clearance from cardiology Dr. Wynonia Lawman 04/28/2018. Per his note "from a cardiovascular viewpoint I think that she could proceed with a lumpectomy safely.  Ideally I would like her to continue aspirin but she can discontinue Plavix 5 days prior to the surgery.  I think at this point she has been stable and her EKG continues to show some inferior lateral T wave changes.  There is a slight increased risk to the operation but this is a more limited procedure than a total mastectomy and I think it would be important to go ahead and get her breast cancer treatment underway and defined.  If she continues to have chest discomfort we can consider intervention or other things afterwards.  At the present time since she is asymptomatic and EKG remained stable I think we can proceed with this course."  Per nurse notes [employer] notified.  Anticipate she can proceed as planned barring acute status change  VS: BP (!) 146/70   Pulse 79   Temp 36.7 C   Resp 18   Ht 5' 1.75" (1.568 m)   Wt 135 lb 1.6 oz (61.3 kg)   SpO2 98%   BMI 24.91 kg/m   PROVIDERS: Leighton Ruff, MD is PCP  Ezzard Standing, MD is Cardiologist last seen 04/28/2018  LABS: Labs reviewed: Acceptable for surgery. (all labs ordered are listed, but only abnormal results are displayed)  Labs Reviewed  BASIC METABOLIC PANEL - Abnormal; Notable for the following components:      Result  Value   Glucose, Bld 102 (*)    Creatinine, Ser 1.34 (*)    GFR calc non Af Amer 35 (*)    GFR calc Af Amer 40 (*)    All other components within normal limits  CBC - Abnormal; Notable for the following components:   RBC 3.70 (*)    MCV 101.4 (*)    All other components within normal limits     IMAGES: CHEST  2 VIEW 11/14/2014  COMPARISON:  11/13/2014.  07/03/2014.  FINDINGS: Mediastinum and hilar structures normal. Stable cardiomegaly. No pulmonary venous congestion . Cardiac pacer lead tips in right atrium and right ventricle. Tiny bilateral pleural effusions cannot be excluded. Surgical clips left axilla. No acute bony abnormality.  IMPRESSION: 1. Cardiac pacer with lead tips in right atrium right ventricle. Cardiomegaly. No pulmonary venous congestion. 2. Small bilateral pleural effusions cannot be excluded.   EKG: 04/28/2018 (copy in pt chart): Sinus rhythm with inferolateral T wave inversions. Stable per Dr. Thurman Coyer notes.   CV: Echo 03/28/2018 ([facility] record, copy in pt chart): Findings: 1.  Left ventricle cavity is normal in size.  Normal global wall motion.  Visual EF is 55 to 60%.  Doppler evidence of grade 1 impaired diastolic dysfunction. 2.  Left atrial cavity is mildly dilated. 3.  Right atrial cavity is normal in size. 4.  Right ventricle cavity is normal size.  Normal  right ventricular function. 5.  Grossly normal trileaflet aortic valve with trace regurgitation. 6.  Mild grade 1 mitral regurgitation.  Mild calcification of the mitral valve annulus. 7.  Structurally normal tricuspid valve with mild regurgitation. 8.  Structurally normal pulmonic valve with mild regurgitation. 9.  No evidence of significant pericardial effusion. 10.  The aortic root is normal. 11.  Normal pulmonary artery. 12.  IVC is normal with respiratory variation Conclusions: 1.  Left ventricle cavity is normal in size.  Normal global wall motion.  Visually it is 55 to 60%.   Doppler evidence of grade 1 impaired diastolic dysfunction. 2.  Left atrial cavity is mildly dilated. 3.  Trileaflet aortic valve with trace regurgitation. 4.  Mild grade 1 mitral regurgitation.  Mild calcification of the mitral valve annulus. 5.  Structurally normal tricuspid valve with mild regurgitation. 6.  Structurally normal pulmonic valve with mild regurgitation  Past Medical History:  Diagnosis Date  . Anemia   . Anginal pain ([place])   . Arthritis   . Breast cancer ([place])   . Chronic kidney disease   . Complete heart block (HCC)    a. s/p MDT dual chamber pacemaker Dr Rayann Heman  . Hyperlipidemia   . Hypertension   . Osteoporosis   . Peripheral vascular disease ([place])   . Personal history of radiation therapy 1985   Left Breast Cancer  . Presence of permanent cardiac pacemaker   . Vertigo     Past Surgical History:  Procedure Laterality Date  . ABDOMINAL HYSTERECTOMY    . APPENDECTOMY    . BREAST LUMPECTOMY Left 1985  . EXCISION MORTON'S NEUROMA Bilateral   . GANGLION CYST EXCISION Left   . JOINT REPLACEMENT Right 2010   knee  . PACEMAKER INSERTION  11/13/14   MDT dual chamber pacemaker implanted by Dr Rayann Heman for CHB  . PERMANENT PACEMAKER INSERTION N/A 11/13/2014   Procedure: PERMANENT PACEMAKER INSERTION;  Surgeon: Thompson Grayer, MD;  Location[facility] CATH LAB;  Service: Cardiovascular;  Laterality: N/A;  . TOTAL HIP ARTHROPLASTY Bilateral     MEDICATIONS: . aspirin EC 81 MG tablet  . atorvastatin (LIPITOR) 20 MG tablet  . Calcium Carb-Cholecalciferol (CALCIUM 600 + D PO)  . clopidogrel (PLAVIX) 75 MG tablet  . Coenzyme Q10-Vitamin E (QUNOL ULTRA COQ10 PO)  . fexofenadine (ALLEGRA) 180 MG tablet  . ketotifen (ZADITOR) 0.025 % ophthalmic solution  . losartan (COZAAR) 50 MG tablet  . magnesium oxide (MAG-OX) 400 MG tablet  . Melatonin 3 MG TABS  . metoprolol succinate (TOPROL-XL) 25 MG 24 hr tablet  . Multiple Vitamin (MULTIVITAMIN WITH MINERALS) TABS tablet  .  nitroGLYCERIN (NITROSTAT) 0.4 MG SL tablet   No current facility-administered medications for this encounter.     Wynonia Musty [facility] Short Stay Center/Anesthesiology Phone [phone] 05/12/2018 11:11 AM

## 2018-05-13 ENCOUNTER — Ambulatory Visit
Admission: RE | Admit: 2018-05-13 | Discharge: 2018-05-13 | Disposition: A | Discharge status: Home / Self Care | Payer: Medicare Other | Source: Ambulatory Visit | Attending: General Surgery | Admitting: General Surgery

## 2018-05-13 DIAGNOSIS — D0512 Intraductal carcinoma in situ of left breast: Secondary | ICD-10-CM

## 2018-05-16 ENCOUNTER — Ambulatory Visit
Admission: RE | Admit: 2018-05-16 | Discharge: 2018-05-16 | Disposition: A | Discharge status: Home / Self Care | Payer: Medicare Other | Source: Ambulatory Visit | Attending: General Surgery | Admitting: General Surgery

## 2018-05-16 ENCOUNTER — Ambulatory Visit (HOSPITAL_COMMUNITY): Payer: Medicare Other | Admitting: Vascular Surgery

## 2018-05-16 ENCOUNTER — Encounter (HOSPITAL_COMMUNITY): Payer: Self-pay | Admitting: Anesthesiology

## 2018-05-16 ENCOUNTER — Ambulatory Visit: Payer: Medicare Other | Admitting: Oncology

## 2018-05-16 ENCOUNTER — Ambulatory Visit (HOSPITAL_COMMUNITY)
Admission: RE | Admit: 2018-05-16 | Discharge: 2018-05-16 | Disposition: A | Discharge status: Home / Self Care | Payer: Medicare Other | Source: Ambulatory Visit | Attending: General Surgery | Admitting: General Surgery

## 2018-05-16 ENCOUNTER — Encounter (HOSPITAL_COMMUNITY)
Admission: RE | Disposition: A | Discharge status: Home / Self Care | Payer: Self-pay | Source: Ambulatory Visit | Attending: General Surgery

## 2018-05-16 ENCOUNTER — Ambulatory Visit (HOSPITAL_COMMUNITY): Payer: Medicare Other | Admitting: Anesthesiology

## 2018-05-16 ENCOUNTER — Other Ambulatory Visit: Payer: Medicare Other

## 2018-05-16 DIAGNOSIS — M1990 Unspecified osteoarthritis, unspecified site: Secondary | ICD-10-CM | POA: Insufficient documentation

## 2018-05-16 DIAGNOSIS — Z7982 Long term (current) use of aspirin: Secondary | ICD-10-CM | POA: Insufficient documentation

## 2018-05-16 DIAGNOSIS — N6032 Fibrosclerosis of left breast: Secondary | ICD-10-CM | POA: Insufficient documentation

## 2018-05-16 DIAGNOSIS — I509 Heart failure, unspecified: Secondary | ICD-10-CM | POA: Insufficient documentation

## 2018-05-16 DIAGNOSIS — Z7902 Long term (current) use of antithrombotics/antiplatelets: Secondary | ICD-10-CM | POA: Diagnosis not present

## 2018-05-16 DIAGNOSIS — D0512 Intraductal carcinoma in situ of left breast: Secondary | ICD-10-CM

## 2018-05-16 DIAGNOSIS — I2510 Atherosclerotic heart disease of native coronary artery without angina pectoris: Secondary | ICD-10-CM | POA: Diagnosis not present

## 2018-05-16 DIAGNOSIS — E7800 Pure hypercholesterolemia, unspecified: Secondary | ICD-10-CM | POA: Insufficient documentation

## 2018-05-16 DIAGNOSIS — I130 Hypertensive heart and chronic kidney disease with heart failure and stage 1 through stage 4 chronic kidney disease, or unspecified chronic kidney disease: Secondary | ICD-10-CM | POA: Diagnosis not present

## 2018-05-16 DIAGNOSIS — N189 Chronic kidney disease, unspecified: Secondary | ICD-10-CM | POA: Insufficient documentation

## 2018-05-16 HISTORY — PX: BREAST LUMPECTOMY WITH RADIOACTIVE SEED LOCALIZATION: SHX6424

## 2018-05-16 SURGERY — BREAST LUMPECTOMY WITH RADIOACTIVE SEED LOCALIZATION
Anesthesia: General | Site: Breast | Laterality: Left

## 2018-05-16 MED ORDER — CHLORHEXIDINE GLUCONATE CLOTH 2 % EX PADS: 6.0000 | MEDICATED_PAD | Freq: Once | CUTANEOUS | Status: DC

## 2018-05-16 MED ORDER — GABAPENTIN 300 MG PO CAPS
300.0000 mg | ORAL_CAPSULE | ORAL | Status: AC
  Administered 2018-05-16: 300 mg via ORAL

## 2018-05-16 MED ORDER — BUPIVACAINE-EPINEPHRINE (PF) 0.25% -1:200000 IJ SOLN
INTRAMUSCULAR | Status: AC
  Filled 2018-05-16: qty 30

## 2018-05-16 MED ORDER — ACETAMINOPHEN 500 MG PO TABS
ORAL_TABLET | ORAL | Status: AC
  Filled 2018-05-16: qty 2

## 2018-05-16 MED ORDER — 0.9 % SODIUM CHLORIDE (POUR BTL) OPTIME
TOPICAL | Status: DC | PRN
  Administered 2018-05-16: 1000 mL

## 2018-05-16 MED ORDER — DEXAMETHASONE SODIUM PHOSPHATE 10 MG/ML IJ SOLN
INTRAMUSCULAR | Status: DC | PRN
  Administered 2018-05-16: 5 mg via INTRAVENOUS

## 2018-05-16 MED ORDER — FENTANYL CITRATE (PF) 100 MCG/2ML IJ SOLN
INTRAMUSCULAR | Status: DC | PRN
  Administered 2018-05-16 (×2): 25 ug via INTRAVENOUS

## 2018-05-16 MED ORDER — CEFAZOLIN SODIUM-DEXTROSE 2-4 GM/100ML-% IV SOLN
INTRAVENOUS | Status: AC
  Filled 2018-05-16: qty 100

## 2018-05-16 MED ORDER — TRAMADOL HCL 50 MG PO TABS: 50.0000 mg | ORAL_TABLET | Freq: Four times a day (QID) | ORAL | 1 refills | Status: AC | PRN

## 2018-05-16 MED ORDER — ACETAMINOPHEN 500 MG PO TABS: 1000.0000 mg | ORAL_TABLET | ORAL | Status: DC

## 2018-05-16 MED ORDER — FENTANYL CITRATE (PF) 250 MCG/5ML IJ SOLN
INTRAMUSCULAR | Status: AC
  Filled 2018-05-16: qty 5

## 2018-05-16 MED ORDER — LACTATED RINGERS IV SOLN
INTRAVENOUS | Status: DC
  Administered 2018-05-16: 11:00:00 via INTRAVENOUS

## 2018-05-16 MED ORDER — CLOPIDOGREL BISULFATE 75 MG PO TABS: 75.0000 mg | ORAL_TABLET | Freq: Every day | ORAL | Status: DC

## 2018-05-16 MED ORDER — LIDOCAINE 2% (20 MG/ML) 5 ML SYRINGE
INTRAMUSCULAR | Status: AC
  Filled 2018-05-16: qty 5

## 2018-05-16 MED ORDER — DEXAMETHASONE SODIUM PHOSPHATE 10 MG/ML IJ SOLN
INTRAMUSCULAR | Status: AC
  Filled 2018-05-16: qty 1

## 2018-05-16 MED ORDER — GABAPENTIN 300 MG PO CAPS
ORAL_CAPSULE | ORAL | Status: AC
  Administered 2018-05-16: 300 mg via ORAL
  Filled 2018-05-16: qty 1

## 2018-05-16 MED ORDER — PROPOFOL 10 MG/ML IV BOLUS
INTRAVENOUS | Status: AC
  Filled 2018-05-16: qty 20

## 2018-05-16 MED ORDER — CEFAZOLIN SODIUM-DEXTROSE 2-4 GM/100ML-% IV SOLN
2.0000 g | INTRAVENOUS | Status: AC
Start: 2018-05-16 — End: 2018-05-16
  Administered 2018-05-16: 2 g via INTRAVENOUS

## 2018-05-16 MED ORDER — PROPOFOL 10 MG/ML IV BOLUS
INTRAVENOUS | Status: DC | PRN
  Administered 2018-05-16: 100 mg via INTRAVENOUS

## 2018-05-16 MED ORDER — LIDOCAINE 2% (20 MG/ML) 5 ML SYRINGE
INTRAMUSCULAR | Status: DC | PRN
  Administered 2018-05-16: 100 mg via INTRAVENOUS

## 2018-05-16 MED ORDER — EPHEDRINE 5 MG/ML INJ
INTRAVENOUS | Status: AC
  Filled 2018-05-16: qty 10

## 2018-05-16 SURGICAL SUPPLY — 43 items
ADH SKN CLS APL DERMABOND .7 (GAUZE/BANDAGES/DRESSINGS) ×1
BINDER BREAST LRG (GAUZE/BANDAGES/DRESSINGS) ×1 IMPLANT
BINDER BREAST XLRG (GAUZE/BANDAGES/DRESSINGS) IMPLANT
BLADE SURG 15 STRL LF DISP TIS (BLADE) ×1 IMPLANT
BLADE SURG 15 STRL SS (BLADE) ×2
CANISTER SUCT 3000ML PPV (MISCELLANEOUS) ×2 IMPLANT
CHLORAPREP W/TINT 26ML (MISCELLANEOUS) ×2 IMPLANT
CLIP VESOCCLUDE SM WIDE 6/CT (CLIP) ×2 IMPLANT
COVER PROBE W GEL 5X96 (DRAPES) ×2 IMPLANT
COVER SURGICAL LIGHT HANDLE (MISCELLANEOUS) ×2 IMPLANT
DERMABOND ADVANCED (GAUZE/BANDAGES/DRESSINGS) ×1
DERMABOND ADVANCED .7 DNX12 (GAUZE/BANDAGES/DRESSINGS) ×1 IMPLANT
DEVICE DUBIN SPECIMEN MAMMOGRA (MISCELLANEOUS) ×2 IMPLANT
DRAPE CHEST BREAST 15X10 FENES (DRAPES) ×2 IMPLANT
DRAPE UTILITY XL STRL (DRAPES) ×2 IMPLANT
DRSG PAD ABDOMINAL 8X10 ST (GAUZE/BANDAGES/DRESSINGS) ×2 IMPLANT
ELECT COATED BLADE 2.86 ST (ELECTRODE) ×2 IMPLANT
ELECT REM PT RETURN 9FT ADLT (ELECTROSURGICAL) ×2
ELECTRODE REM PT RTRN 9FT ADLT (ELECTROSURGICAL) ×1 IMPLANT
GLOVE BIOGEL PI IND STRL 8 (GLOVE) ×1 IMPLANT
GLOVE BIOGEL PI INDICATOR 8 (GLOVE) ×1
GLOVE ECLIPSE 7.5 STRL STRAW (GLOVE) ×2 IMPLANT
GOWN STRL REUS W/ TWL LRG LVL3 (GOWN DISPOSABLE) ×1 IMPLANT
GOWN STRL REUS W/ TWL XL LVL3 (GOWN DISPOSABLE) ×1 IMPLANT
GOWN STRL REUS W/TWL LRG LVL3 (GOWN DISPOSABLE) ×2
GOWN STRL REUS W/TWL XL LVL3 (GOWN DISPOSABLE) ×2
KIT BASIN OR (CUSTOM PROCEDURE TRAY) ×2 IMPLANT
KIT MARKER MARGIN INK (KITS) ×2 IMPLANT
NDL HYPO 25GX1X1/2 BEV (NEEDLE) ×1 IMPLANT
NEEDLE HYPO 25GX1X1/2 BEV (NEEDLE) ×2 IMPLANT
NS IRRIG 1000ML POUR BTL (IV SOLUTION) ×2 IMPLANT
PACK SURGICAL SETUP 50X90 (CUSTOM PROCEDURE TRAY) ×2 IMPLANT
PAD ABD 8X10 STRL (GAUZE/BANDAGES/DRESSINGS) ×1 IMPLANT
PENCIL BUTTON HOLSTER BLD 10FT (ELECTRODE) ×2 IMPLANT
SPONGE LAP 18X18 X RAY DECT (DISPOSABLE) ×2 IMPLANT
SUT MON AB 5-0 PS2 18 (SUTURE) ×2 IMPLANT
SUT VIC AB 3-0 SH 18 (SUTURE) ×2 IMPLANT
SYR BULB 3OZ (MISCELLANEOUS) ×2 IMPLANT
SYR CONTROL 10ML LL (SYRINGE) ×2 IMPLANT
TOWEL OR 17X24 6PK STRL BLUE (TOWEL DISPOSABLE) ×2 IMPLANT
TOWEL OR 17X26 10 PK STRL BLUE (TOWEL DISPOSABLE) ×2 IMPLANT
TUBE CONNECTING 12X1/4 (SUCTIONS) ×2 IMPLANT
YANKAUER SUCT BULB TIP NO VENT (SUCTIONS) ×2 IMPLANT

## 2018-05-16 NOTE — H&P (Signed)
History of Present Illness [name] T. Karen Schreier MD; 04/28/2018 10:32 AM) The patient is a 84 year old female who presents with breast cancer. Patient is a very pleasant 84 year old female who presents with a new diagnosis of ductal carcinoma in situ of the left breast. The patient has a previous history of left breast lumpectomy, axillary dissection with 16 negative lymph nodes per patient, and radiation in 1985 for breast cancer. I do not have records of this.. She also has a significant history of 2 sisters with breast cancer. She has continued routine screening. She recently presented for routine mammogram showing new calcifications in the left breast upper outer quadrant. Subsequent diagnostic mammogram was performed showing a 2.4 x 1.1 cm group of indeterminate calcifications of left breast slightly upper outer quadrant posterior depth in close proximity to the previous lumpectomy scar. Stereotactic large core needle biopsy was performed on April 19, 2018 showing intermediate grade ductal carcinoma in situ associated with calcifications, 100% ER positive, PR negative. She has had no breast symptoms, specifically lump or pain or nipple discharge. Patient is in relatively good health for age. She lives at [name] but is independent. Some moderate comorbidities of coronary artery disease, hypertension and elevated cholesterol. She has never had genetic testing.   Past Surgical History ([name], [name]; 04/28/2018 10:05 AM) Appendectomy  Breast Biopsy  Left. Breast Mass; Local Excision  Left. Cataract Surgery  Bilateral. Foot Surgery  Bilateral. Hip Surgery  Bilateral. Hysterectomy (not due to cancer) - Partial  Knee Surgery  Right. Sentinel Lymph Node Biopsy   Diagnostic Studies History ([name], [name]; 04/28/2018 10:05 AM) Colonoscopy  5-10 years ago Mammogram  within last year  Allergies ([name], [name]; 04/28/2018 10:05 AM) No Known Drug Allergies  [04/28/2018]: Allergies Reconciled   Medication History ([name], [name]; 04/28/2018 10:09 AM) Atorvastatin Calcium (20MG  Tablet, Oral) Active. Metoprolol Tartrate (25MG  Tablet, Oral) Active. Losartan Potassium (50MG  Tablet, Oral) Active. Plavix (75MG  Tablet, Oral) Active. Aspirin (81MG  Tablet, Oral) Active. Multi-Vitamin (Oral) Active. Magnesium Citrate (200MG  Tablet, Oral) Active. Calcium-Vitamin D (Oral) Specific strength unknown - Active. Melatonin (1MG  Tablet, Oral) Active. Allegra Allergy (60MG  Tablet, Oral) Active. Medications Reconciled  Social History ([name], [name]; 04/28/2018 10:05 AM) Alcohol use  Occasional alcohol use. Caffeine use  Coffee. No drug use  Tobacco use  Never smoker.  Family History ([name], [name]; 04/28/2018 10:05 AM) Alcohol Abuse  Brother, Father, Sister. Arthritis  Mother. Breast Cancer  Sister. Cerebrovascular Accident  Mother. Heart Disease  Father. Heart disease in female family member before age 63  Hypertension  Daughter. Kidney Disease  Family Members In General. Respiratory Condition  Brother.  Pregnancy / Birth History ([name], [name]; 04/28/2018 10:05 AM) Age at menarche  66 years. Age of menopause  67-55 Gravida  4 Maternal age  34-25 Para  31  Other Problems ([name], [name]; 04/28/2018 10:05 AM) Arthritis  Back Pain  Bladder Problems  Breast Cancer  Chest pain  Chronic Renal Failure Syndrome  Congestive Heart Failure  Diverticulosis  High blood pressure  Hypercholesterolemia  Lump In Breast  Oophorectomy     Review of Systems (Karen A. [name]; 04/28/2018 10:05 AM) General Not Present- Appetite Loss, Chills, Fatigue, Fever, Night Sweats, Weight Gain and Weight Loss. Skin Not Present- Change in Wart/Mole, Dryness, Hives, Jaundice, New Lesions, Non-Healing Wounds, Rash and Ulcer. HEENT Not Present- Earache, Hearing Loss, Hoarseness, Nose Bleed,  Oral Ulcers, Ringing in the Ears,  Seasonal Allergies, Sinus Pain, Sore Throat, Visual Disturbances, Wears glasses/contact lenses and Yellow Eyes. Respiratory Not Present- Bloody sputum, Chronic Cough, Difficulty Breathing, Snoring and Wheezing. Breast Present- Breast Mass. Not Present- Breast Pain, Nipple Discharge and Skin Changes. Gastrointestinal Not Present- Abdominal Pain, Bloating, Bloody Stool, Change in Bowel Habits, Chronic diarrhea, Constipation, Difficulty Swallowing, Excessive gas, Gets full quickly at meals, Hemorrhoids, Indigestion, Nausea, Rectal Pain and Vomiting. Female Genitourinary Present- Frequency. Not Present- Nocturia, Painful Urination, Pelvic Pain and Urgency. Musculoskeletal Not Present- Back Pain, Joint Pain, Joint Stiffness, Muscle Pain, Muscle Weakness and Swelling of Extremities. Neurological Not Present- Decreased Memory, Fainting, Headaches, Numbness, Seizures, Tingling, Tremor, Trouble walking and Weakness. Psychiatric Present- Change in Sleep Pattern. Not Present- Anxiety, Bipolar, Depression, Fearful and Frequent crying. Endocrine Present- Cold Intolerance. Not Present- Excessive Hunger, Hair Changes, Heat Intolerance, Hot flashes and New Diabetes. Hematology Present- Blood Thinners. Not Present- Easy Bruising, Excessive bleeding, Gland problems, HIV and Persistent Infections.  Vitals ([facility] A. [name]; 04/28/2018 10:05 AM) 04/28/2018 10:05 AM Weight: 136.8 lb Height: 61.5in Body Surface Area: 1.62 m Body Mass Index: 25.43 kg/m  Temp.: 91F  Pulse: 95 (Regular)  BP: 122/82 (Sitting, Left Arm, Standard)       Physical Exam [name] T. Link Burgeson MD; 04/28/2018 10:34 AM) The physical exam findings are as follows: Note:General: Alert, well-developed and well nourished Caucasian female who appears younger than her age, in no distress Skin: Warm and dry without rash or infection. HEENT: No palpable masses or thyromegaly. Sclera nonicteric.  Pupils equal round and reactive. Lymph nodes: No cervical, supraclavicular, nodes palpable. Breasts: Mild shrinkage on the left postradiation. I cannot feel any masses in either breast. Status post left axillary dissection. No palpable axillary adenopathy or masses. Lungs: Breath sounds clear and equal. No wheezing or increased work of breathing. Cardiovascular: Regular rate and rhythm without murmer. No JVD or edema. Peripheral pulses intact. No carotid bruits. Abdomen: Nondistended. Soft and nontender. No masses palpable. No organomegaly. No palpable hernias. Extremities: No edema or joint swelling or deformity. No chronic venous stasis changes. Neurologic: Alert and fully oriented. Gait normal. No focal weakness. Psychiatric: Normal mood and affect. Thought content appropriate with normal judgement and insight    Assessment & Plan [name] T. Fronie Holstein MD; 04/28/2018 10:54 AM) BREAST NEOPLASM, TIS (DCIS), LEFT (Z61.09) Impression: 84 year old female with new diagnosis of intermediate grade DCIS, approximate 2 cm, upper outer quadrant left breast. Personal history of invasive left breast cancer status post lumpectomy radiation and axillary dissection 1985. Strong family history of breast cancer in 2 sisters. Had a long discussion with the patient and her daughter. We discussed range of options. We discussed that aggressive standard therapy would be total mastectomy. However at her age I think she definitely has other options. We discussed the possibility of observation alone. Discussed hormonal therapy alone. Discussed lumpectomy with or without hormonal therapy. After discussion she would like to have surgical therapy and I think despite her previous history lumpectomy with or without radiation therapy at her age would be reasonable. We discussed the procedure in detail including radioactive seed localization, risks of bleeding, infection, recurrence, need for surgery based on positive margins. We  discussed we would need to get cardiac clearance preoperatively. We will refer to medical oncology and to genetics. I will back in touch with her after we get cardiac clearance. Current Plans Referred to Oncology, for evaluation and follow up (Oncology). Routine. Referred to Genetic Counseling, for evaluation and follow up [employer]). Routine. I recommended  obtaining preoperative cardiac clearance. I am concerned about the health of the patient and the ability to tolerate the operation. Therefore, we will request clearance by cardiology to better assess operative risk & see if a reevaluation, further workup, etc is needed. Also recommendations on how medications such as for anticoagulation and blood pressure should be managed/held/restarted after surgery. Radioactive seed localized left breast lumpectomy under general anesthesia as an outpatient pending cardiac clearance

## 2018-05-16 NOTE — Op Note (Signed)
Preoperative Diagnosis: LEFT BREAST DUCTAL CARCINOMA IN SITU  Postoprative Diagnosis: LEFT BREAST DUCTAL CARCINOMA IN SITU  Procedure: Procedure(s): Left BREAST LUMPECTOMY WITH RADIOACTIVE SEED LOCALIZATION   Surgeon: Excell Seltzer T   Assistants: None  Anesthesia:  General LMA anesthesia  Indications: 84 year old female with new diagnosis of intermediate grade DCIS, approximate 2 cm, upper outer quadrant left breast. Personal history of invasive left breast cancer status post lumpectomy radiation and axillary dissection 1985.  After preoperative evaluation and discussion detailed elsewhere we have elected to proceed with radioactive seed localized lumpectomy as surgical therapy, patient declining standard aggressive therapy which would be total mastectomy due to her age which we feel is very reasonable.  We previously discussed the nature of the surgery, indications and risks also detailed elsewhere.      Procedure Detail: Patient had undergone accurate placement of radioactive seed at the tumor and clip site in the posterior upper outer left breast at the site of her previous lumpectomy.  She was taken to the operating room, placed in supine position on the operating table, and laryngeal mask general anesthesia induced.  The breast was widely sterilely prepped and draped.  She received preoperative IV antibiotics.  PAS were in place.  Patient timeout was performed and correct procedure verified.  The neoprobe was used to localize the seed along the inferior aspect of her previous lumpectomy incision.  I made a curvilinear incision here directly over the area of high counts incorporating a portion of her previous lumpectomy incision and extending this inferiorly.  Short skin and subcutaneous flaps were raised in either direction as the seed was very superficial.  There was an area of induration or mass or possible scarring from her previous lumpectomy in the same area.  Using the neoprobe  for guidance I excised a generous portion of breast tissue with cautery including the palpable abnormality and this was extended down to the chest wall and the specimen removed.  The specimen measured approximately 5 x 2-1/2 x 2-1/2 cm.  This was inked for margins.  Specimen x-ray showed the seed and the marking clip fairly centrally located in the specimen although slightly closer to the superior and lateral margins and the neoprobe indicated the seed quite close to the superficial margin.  I excised an additional approximately half centimeters superior and lateral margin which were oriented and sent as separate specimens.  I also excised skin and subcutaneous tissue over the lumpectomy site as further superficial margin.  The soft tissue was infiltrated with Marcaine.  The breast tissue was mobilized somewhat off the chest wall for closure in the deep breast and subcutaneous tissue was closed with interrupted 3-0 Vicryl and the skin closed with a running some particular 5-0 Monocryl and Dermabond.  Sponge needle and instrument counts were correct.  Breast binder was placed.    Findings: As above  Estimated Blood Loss:  Minimal         Drains: None  Blood Given: none          Specimens: #1 left breast lumpectomy oriented #2 further lateral margin oriented    #3 further superior margin oriented   #4 further superficial margin (skin and subcutaneous tissue)        Complications:  * No complications entered in OR log *         Disposition: PACU - hemodynamically stable.         Condition: stable

## 2018-05-16 NOTE — Anesthesia Procedure Notes (Signed)
Procedure Name: LMA Insertion Date/Time: 05/16/2018 12:31 PM Performed by: Kyung Rudd, CRNA Pre-anesthesia Checklist: Patient identified, Emergency Drugs available, Suction available, Patient being monitored and Timeout performed Patient Re-evaluated:Patient Re-evaluated prior to induction Oxygen Delivery Method: Circle system utilized Preoxygenation: Pre-oxygenation with 100% oxygen Induction Type: IV induction LMA: LMA inserted LMA Size: 4.0 Number of attempts: 1 Placement Confirmation: positive ETCO2 and breath sounds checked- equal and bilateral Tube secured with: Tape Dental Injury: Teeth and Oropharynx as per pre-operative assessment

## 2018-05-16 NOTE — Anesthesia Preprocedure Evaluation (Addendum)
Anesthesia Evaluation  Patient identified by MRN, date of birth, ID band Patient awake    Reviewed: Allergy & Precautions, H&P , NPO status , Patient's Chart, lab work & pertinent test results, reviewed documented beta blocker date and time   Airway Mallampati: II  TM Distance: >3 FB Neck ROM: full    Dental no notable dental hx. (+) Partial Lower, Dental Advisory Given   Pulmonary    Pulmonary exam normal breath sounds clear to auscultation       Cardiovascular Exercise Tolerance: Good hypertension, + angina + Peripheral Vascular Disease  + dysrhythmias + pacemaker  Rhythm:regular Rate:Normal    EKG: 04/28/2018 Sinus rhythm with inferolateral T wave inversions. Stable per Dr. Thurman Coyer notes.  CV: Echo 03/28/2018  Findings: 1.  Left ventricle cavity is normal in size.  Normal global wall motion.  Visually it is 55 to 60%.  Doppler evidence of grade 1 impaired diastolic dysfunction. 2.  Left atrial cavity is mildly dilated. 3.  Trileaflet aortic valve with trace regurgitation. 4.  Mild grade 1 mitral regurgitation.  Mild calcification of the mitral valve annulus. 5.  Structurally normal tricuspid valve with mild regurgitation. 6.  Structurally normal pulmonic valve with mild regurgitation     Neuro/Psych    GI/Hepatic   Endo/Other    Renal/GU Renal disease     Musculoskeletal  (+) Arthritis , Osteoarthritis,    Abdominal   Peds  Hematology  (+) anemia ,   Anesthesia Other Findings   Reproductive/Obstetrics                          Anesthesia Physical Anesthesia Plan  ASA: III  Anesthesia Plan: General   Post-op Pain Management:    Induction: Intravenous  PONV Risk Score and Plan: 3 and Ondansetron, Treatment may vary due to age or medical condition and Dexamethasone  Airway Management Planned: Oral ETT and LMA  Additional Equipment:   Intra-op Plan:   Post-operative  Plan: Extubation in OR  Informed Consent: I have reviewed the patients History and Physical, chart, labs and discussed the procedure including the risks, benefits and alternatives for the proposed anesthesia with the patient or authorized representative who has indicated his/her understanding and acceptance.   Dental Advisory Given  Plan Discussed with: CRNA, Anesthesiologist and Surgeon  Anesthesia Plan Comments: (  )        Anesthesia Quick Evaluation

## 2018-05-16 NOTE — Discharge Instructions (Addendum)
[facility] °Office Phone Number [phone] ° °BREAST BIOPSY/ PARTIAL MASTECTOMY: POST OP INSTRUCTIONS ° °Always review your discharge instruction sheet given to you by the facility where your surgery was performed. ° °IF YOU HAVE DISABILITY OR FAMILY LEAVE FORMS, YOU MUST BRING THEM TO THE OFFICE FOR PROCESSING.  DO NOT GIVE THEM TO YOUR DOCTOR. ° °1. A prescription for pain medication may be given to you upon discharge.  Take your pain medication as prescribed, if needed.  If narcotic pain medicine is not needed, then you may take acetaminophen (Tylenol) or ibuprofen (Advil) as needed. °2. Take your usually prescribed medications unless otherwise directed °3. If you need a refill on your pain medication, please contact your pharmacy.  They will contact our office to request authorization.  Prescriptions will not be filled after 5pm or on week-ends. °4. You should eat very light the first 24 hours after surgery, such as soup, crackers, pudding, etc.  Resume your normal diet the day after surgery. °5. Most patients will experience some swelling and bruising in the breast.  Ice packs and a good support bra will help.  Swelling and bruising can take several days to resolve.  °6. It is common to experience some constipation if taking pain medication after surgery.  Increasing fluid intake and taking a stool softener will usually help or prevent this problem from occurring.  A mild laxative (Milk of Magnesia or Miralax) should be taken according to package directions if there are no bowel movements after 48 hours. °7. Unless discharge instructions indicate otherwise, you may remove your bandages 24-48 hours after surgery, and you may shower at that time.  You may have steri-strips (small skin tapes) in place directly over the incision.  These strips should be left on the skin for 7-10 days.  If your surgeon used skin glue on the incision, you may shower in 24 hours.  The glue will flake off over the  next 2-3 weeks.  Any sutures or staples will be removed at the office during your follow-up visit. °8. ACTIVITIES:  You may resume regular daily activities (gradually increasing) beginning the next day.  Wearing a good support bra or sports bra minimizes pain and swelling.  You may have sexual intercourse when it is comfortable. °a. You may drive when you no longer are taking prescription pain medication, you can comfortably wear a seatbelt, and you can safely maneuver your car and apply brakes. °b. RETURN TO WORK:  ______________________________________________________________________________________ °9. You should see your doctor in the office for a follow-up appointment approximately two weeks after your surgery.  Your doctor’s nurse will typically make your follow-up appointment when she calls you with your pathology report.  Expect your pathology report 2-3 business days after your surgery.  You may call to check if you do not hear from us after three days. °10. OTHER INSTRUCTIONS: _______________________________________________________________________________________________ _____________________________________________________________________________________________________________________________________ °_____________________________________________________________________________________________________________________________________ °_____________________________________________________________________________________________________________________________________ ° °WHEN TO CALL YOUR DOCTOR: °1. Fever over 101.0 °2. Nausea and/or vomiting. °3. Extreme swelling or bruising. °4. Continued bleeding from incision. °5. Increased pain, redness, or drainage from the incision. ° °The clinic staff is available to answer your questions during regular business hours.  Please don’t hesitate to call and ask to speak to one of the nurses for clinical concerns.  If you have a medical emergency, go to the nearest  emergency room or call 911.  A surgeon from [facility] is always on call at the hospital. ° °For further questions, please visit [email or web address]  °

## 2018-05-16 NOTE — Interval H&P Note (Signed)
History and Physical Interval Note:  05/16/2018 12:10 PM  [name]  has presented today for surgery, with the diagnosis of LEFT BREAST DUCTAL CARCINOMA IN SITU  The various methods of treatment have been discussed with the patient and family. After consideration of risks, benefits and other options for treatment, the patient has consented to  Procedure(s): BREAST LUMPECTOMY WITH RADIOACTIVE SEED LOCALIZATION (Left) as a surgical intervention .  The patient's history has been reviewed, patient examined, no change in status, stable for surgery.  I have reviewed the patient's chart and labs.  Questions were answered to the patient's satisfaction.     Darene Lamer [name]

## 2018-05-16 NOTE — Transfer of Care (Signed)
Immediate Anesthesia Transfer of Care Note  Patient: [name]  Procedure(s) Performed: BREAST LUMPECTOMY WITH RADIOACTIVE SEED LOCALIZATION (Left Breast)  Patient Location: PACU  Anesthesia Type:General  Level of Consciousness: awake, alert  and oriented  Airway & Oxygen Therapy: Patient Spontanous Breathing and Patient connected to nasal cannula oxygen  Post-op Assessment: Report given to RN, Post -op Vital signs reviewed and stable and Patient moving all extremities X 4  Post vital signs: Reviewed and stable  Last Vitals:  Vitals Value Taken Time  BP 114/96 05/16/2018  1:46 PM  Temp    Pulse 65 05/16/2018  1:49 PM  Resp 17 05/16/2018  1:49 PM  SpO2 99 % 05/16/2018  1:49 PM  Vitals shown include unvalidated device data.  Last Pain:  Vitals:   05/16/18 1049  TempSrc: Oral         Complications: No apparent anesthesia complications

## 2018-05-17 ENCOUNTER — Encounter (HOSPITAL_COMMUNITY): Payer: Self-pay | Admitting: General Surgery

## 2018-05-17 NOTE — Anesthesia Postprocedure Evaluation (Signed)
Anesthesia Post Note  Patient: TAIRA KNABE  Procedure(s) Performed: BREAST LUMPECTOMY WITH RADIOACTIVE SEED LOCALIZATION (Left Breast)     Patient location during evaluation: PACU Anesthesia Type: General Level of consciousness: awake and alert Pain management: pain level controlled Vital Signs Assessment: post-procedure vital signs reviewed and stable Respiratory status: spontaneous breathing, nonlabored ventilation, respiratory function stable and patient connected to nasal cannula oxygen Cardiovascular status: blood pressure returned to baseline and stable Postop Assessment: no apparent nausea or vomiting Anesthetic complications: no    Last Vitals:  Vitals:   05/16/18 1445 05/16/18 1500  BP: (!) 123/57 (!) 125/56  Pulse: 60 64  Resp: 15 16  Temp: (!) 36.3 C   SpO2: 94% 98%    Last Pain:  Vitals:   05/16/18 1400  TempSrc:   PainSc: Asleep                 Neyda Durango

## 2018-05-25 ENCOUNTER — Encounter: Payer: Self-pay | Admitting: Adult Health

## 2018-05-25 DIAGNOSIS — Z170 Estrogen receptor positive status [ER+]: Secondary | ICD-10-CM

## 2018-05-25 DIAGNOSIS — C50412 Malignant neoplasm of upper-outer quadrant of left female breast: Secondary | ICD-10-CM | POA: Insufficient documentation

## 2018-05-25 HISTORY — DX: Estrogen receptor positive status (ER+): Z17.0

## 2018-05-25 HISTORY — DX: Malignant neoplasm of upper-outer quadrant of left female breast: C50.412

## 2018-05-31 ENCOUNTER — Other Ambulatory Visit: Payer: Medicare Other

## 2018-05-31 ENCOUNTER — Ambulatory Visit: Payer: Medicare Other | Admitting: Oncology

## 2018-06-07 ENCOUNTER — Other Ambulatory Visit: Payer: Self-pay

## 2018-06-07 DIAGNOSIS — C50412 Malignant neoplasm of upper-outer quadrant of left female breast: Secondary | ICD-10-CM

## 2018-06-07 DIAGNOSIS — Z170 Estrogen receptor positive status [ER+]: Principal | ICD-10-CM

## 2018-06-07 NOTE — Progress Notes (Signed)
[name]  Telephone:([phone]) [phone] Fax:(336) [phone]     ID: [name] DOB: 06-14-1929  MR#: [id number]  IHW#:[id number]  Patient Care Team: Karen Ruff, MD as PCP - General (Family Medicine) Karen Reedy, MD as Consulting Physician (Cardiology) [name], Karen Dad, MD as Consulting Physician (Oncology) Karen Seltzer, MD as Consulting Physician (General Surgery) [name], DPM as Consulting Physician (Podiatry) Karen Ruddy I, NP (Nurse Practitioner) OTHER MD:  CHIEF COMPLAINT: Estrogen receptor positive ductal carcinoma in situ  CURRENT TREATMENT: Anastrozole   HISTORY OF CURRENT ILLNESS: [name] has a history of left breast cancer in 1989, s/p lumpectomy and left axillary lymph node dissection (with 16  lymph nodes removed, all clear) followed by 5 weeks of radiation. She did not receive chemotherapy or antiestrogens and does not recall meeting with a medical oncologist.  More recently, she had routine screening mammography on 04/11/2018 showing a possible abnormality in the left breast. She underwent unilateral left diagnostic mammography with tomography at [facility] on 04/15/2018 showing: breast density category B. There are grouped calcifications in the left breast measuring 2.4 x 1.1 x 0.7 cm. There is no associated mass.   Accordingly on 04/19/2018 she proceeded to biopsy of the left breast area in question. The pathology from this procedure showed ([id number]): Ductal carcinoma in situ, intermediate grade with calcifications. Prognostic indicators significant for: estrogen receptor, 100% positive with strong staining intensity and progesterone receptor, 0% negative.  She underwent left lumpectomy ([id number]) on 05/16/2018 with pathology showing: High grade ductal carcinoma in situ with calcifications measuring 0.9 cm. Resection margins are negative  The patient's subsequent history is as detailed  below.  INTERVAL HISTORY: [name] was evaluated in the breast cancer clinic on 06/07/2018 accompanied by her daughter. The patient's case was also presented at the multidisciplinary breast cancer conference on 05/25/2018. At that time a preliminary plan was proposed: Antiestrogens, genetics testing   REVIEW OF SYSTEMS: There were no specific symptoms leading to the original mammogram, which was routinely scheduled.  The patient exercises just about daily in [facility] where she resides.  The patient denies unusual headaches, visual changes, nausea, vomiting, stiff neck, dizziness, or gait imbalance. There has been no cough, phlegm production, or pleurisy, no chest pain or pressure, and no change in bowel or bladder habits. The patient denies fever, rash, bleeding, unexplained fatigue or unexplained weight loss. A detailed review of systems was otherwise entirely negative.   PAST MEDICAL HISTORY: Past Medical History:  Diagnosis Date  . Anemia   . Anginal pain ([place])   . Arthritis   . Breast cancer ([place])   . Chronic kidney disease   . Complete heart block (H[facility])    a. s/p MDT dual chamber pacemaker Dr Rayann Heman  . Hyperlipidemia   . Hypertension   . Osteoporosis   . Peripheral vascular disease ([place])   . Personal history of radiation therapy 1985   Left Breast Cancer  . Presence of permanent cardiac pacemaker   . Vertigo     PAST SURGICAL HISTORY: Past Surgical History:  Procedure Laterality Date  . ABDOMINAL HYSTERECTOMY    . APPENDECTOMY    . BREAST LUMPECTOMY Left 1985  . BREAST LUMPECTOMY WITH RADIOACTIVE SEED LOCALIZATION Left 05/16/2018   Procedure: BREAST LUMPECTOMY WITH RADIOACTIVE SEED LOCALIZATION;  Surgeon: Karen Seltzer, MD;  Location: [facility];  Service: General;  Laterality: Left;  . EXCISION MORTON'S NEUROMA Bilateral   . GANGLION CYST EXCISION Left   . JOINT REPLACEMENT  Right 2010   knee  . PACEMAKER INSERTION  11/13/14   MDT dual chamber pacemaker implanted by Dr  Rayann Heman for CHB  . PERMANENT PACEMAKER INSERTION N/A 11/13/2014   Procedure: PERMANENT PACEMAKER INSERTION;  Surgeon: Thompson Grayer, MD;  Location: [facility] CATH LAB;  Service: Cardiovascular;  Laterality: N/A;  . TOTAL HIP ARTHROPLASTY Bilateral   Bilateral cataract removal   FAMILY HISTORY Family History  Problem Relation Age of Onset  . Hypertension Mother        possibly per pt  . Hyperlipidemia Mother        possibly per pt  . Transient ischemic attack Mother        multiple  . CVA Mother   . Alcoholism Father   . Other Father 65       cerebral hemorrhage  . Cirrhosis Brother   . Alcoholism Brother   . Cancer Maternal Aunt        lung  . Cancer Sister        breast  . Breast cancer Sister   . Cancer Sister        breast  . Breast cancer Sister   The patient's father was a heavy smoker and drinker and died at age 49 from cerebral hemorrhage. The patient's mother  died from a stroke and coma at age 76. The patient had 3 brothers and 2 sisters all now deceased. The patient's oldest sister was diagnosed with breast cancer at 7 and again at 57. The patient's younger sister was diagnosed with breast cancer at age 67 and died at age 39. The patient's older brother died of lung cancer (he was a heavy smoker). The patient's younger brother died of liver cirrhosis (he was a heavy drinker and smoker).  A third brother died in his 63s.  There is no history of ovarian cancer in the family.   GYNECOLOGIC HISTORY:  Status post remote hysterectomy and unilateral salpingo-oophorectomy age 68 Menarche: 84 years old Age at first live birth: 84 years old She is GX P3.  She took estrogen HRT from age [age].     SOCIAL HISTORY:  [name] lives in [place] [facility]. She exercises everyday and loves swimming. She is divorced. The patient's daughter [name] is a NICU RN at [employer] patient's son, [name] lives in [place] and works for [employer] in [place]. The  patient's son [name] is a Scientist, research (medical) for the [employer] in [place], [place]. The patient has 6 grandchildren, no great-grandchildren. She attends [employer].     ADVANCED DIRECTIVES: The patient's daughter Collie Siad is her healthcare power of attorney   HEALTH MAINTENANCE: Social History   Tobacco Use  . Smoking status: Never Smoker  . Smokeless tobacco: Never Used  Substance Use Topics  . Alcohol use: Yes    Comment: rarely  . Drug use: No     Colonoscopy: Dr. Wallis Mart  PAP:  Bone density: on 10/24/2017 showed a T score of -2.8 osteoporosis   Allergies  Allergen Reactions  . Molds & Smuts Hives    Current Outpatient Medications  Medication Sig Dispense Refill  . aspirin EC 81 MG tablet Take 81 mg by mouth daily.    Marland Kitchen atorvastatin (LIPITOR) 20 MG tablet Take 20 mg by mouth every evening.    . Calcium Carb-Cholecalciferol (CALCIUM 600 + D PO) Take 1 tablet by mouth 2 (two) times daily.     . clopidogrel (PLAVIX) 75 MG tablet Take  1 tablet (75 mg total) by mouth daily.    . Coenzyme Q10-Vitamin E (QUNOL ULTRA COQ10 PO) Take 1 capsule by mouth every evening.    . fexofenadine (ALLEGRA) 180 MG tablet Take 180 mg by mouth daily.    Marland Kitchen ketotifen (ZADITOR) 0.025 % ophthalmic solution Place 1 drop into both eyes 2 (two) times daily as needed (for itchy eyes.).    Marland Kitchen losartan (COZAAR) 50 MG tablet Take 50 mg by mouth every evening.     . magnesium oxide (MAG-OX) 400 MG tablet Take 400 mg by mouth daily.    . Melatonin 3 MG TABS Take 6 mg by mouth at bedtime.    . metoprolol succinate (TOPROL-XL) 25 MG 24 hr tablet Take 25 mg by mouth daily.    . Multiple Vitamin (MULTIVITAMIN WITH MINERALS) TABS tablet Take 1 tablet by mouth daily. Multivitamin for 50+    . nitroGLYCERIN (NITROSTAT) 0.4 MG SL tablet Place 0.4 mg under the tongue every 5 (five) minutes x 3 doses as needed. Chest pain    . traMADol (ULTRAM) 50 MG tablet Take 1 tablet (50 mg  total) by mouth every 6 (six) hours as needed. 10 tablet 1   No current facility-administered medications for this visit.     OBJECTIVE: Older white woman who appears well  Vitals:   06/08/18 1608  BP: 123/66  Pulse: 87  Resp: 18  Temp: 97.8 F (36.6 C)  SpO2: 99%     Body mass index is 24.8 kg/m.   Wt Readings from Last 3 Encounters:  06/08/18 134 lb 8 oz (61 kg)  05/16/18 135 lb (61.2 kg)  05/11/18 135 lb 1.6 oz (61.3 kg)      ECOG FS:0 - Asymptomatic  Ocular: Sclerae unicteric, pupils round and equal Ear-nose-throat: Oropharynx clear and moist Lymphatic: No cervical or supraclavicular adenopathy Lungs no rales or rhonchi Heart regular rate and rhythm Abd soft, nontender, positive bowel sounds MSK no focal spinal tenderness, no joint edema Neuro: non-focal, well-oriented, appropriate affect Breasts: The right breast is unremarkable.  The right axilla is benign.  The left breast is status post remote lumpectomy and radiation and recent lumpectomy.  The incision is healing very nicely.  There is no evidence of residual or recurrent disease.  The left axilla is benign.   LAB RESULTS:  CMP     Component Value Date/Time   NA 140 06/08/2018 1551   K 4.3 06/08/2018 1551   CL 101 06/08/2018 1551   CO2 30 06/08/2018 1551   GLUCOSE 109 (H) 06/08/2018 1551   BUN 24 (H) 06/08/2018 1551   CREATININE 1.55 (H) 06/08/2018 1551   CALCIUM 10.0 06/08/2018 1551   PROT 7.6 06/08/2018 1551   ALBUMIN 3.9 06/08/2018 1551   AST 24 06/08/2018 1551   ALT 14 06/08/2018 1551   ALKPHOS 103 06/08/2018 1551   BILITOT 0.4 06/08/2018 1551   GFRNONAA 29 (L) 06/08/2018 1551   GFRAA 34 (L) 06/08/2018 1551    No results found for: TOTALPROTELP, ALBUMINELP, A1GS, A2GS, BETS, BETA2SER, GAMS, MSPIKE, SPEI  No results found for: KPAFRELGTCHN, LAMBDASER, KAPLAMBRATIO  Lab Results  Component Value Date   WBC 6.8 06/08/2018   NEUTROABS 3.2 06/08/2018   HGB 11.8 06/08/2018   HCT 34.8  06/08/2018   MCV 97.0 06/08/2018   PLT 300 06/08/2018    _0 @  No results found for: LABCA2  No components found for: MWUXLK440  No results for input(s): INR in the last 168 hours.  No results found for: LABCA2  No results found for: AQT622  No results found for: QJF354  No results found for: TGY563  No results found for: CA2729  No components found for: HGQUANT  No results found for: CEA1 / No results found for: CEA1   No results found for: AFPTUMOR  No results found for: [place]  No results found for: PSA1  Appointment on 06/08/2018  Component Date Value Ref Range Status  . Sodium 06/08/2018 140  135 - 145 mmol/L Final  . Potassium 06/08/2018 4.3  3.5 - 5.1 mmol/L Final  . Chloride 06/08/2018 101  98 - 111 mmol/L Final  . CO2 06/08/2018 30  22 - 32 mmol/L Final  . Glucose, Bld 06/08/2018 109* 70 - 99 mg/dL Final  . BUN 06/08/2018 24* 8 - 23 mg/dL Final  . Creatinine 06/08/2018 1.55* 0.44 - 1.00 mg/dL Final  . Calcium 06/08/2018 10.0  8.9 - 10.3 mg/dL Final  . Total Protein 06/08/2018 7.6  6.5 - 8.1 g/dL Final  . Albumin 06/08/2018 3.9  3.5 - 5.0 g/dL Final  . AST 06/08/2018 24  15 - 41 U/L Final  . ALT 06/08/2018 14  0 - 44 U/L Final  . Alkaline Phosphatase 06/08/2018 103  38 - 126 U/L Final  . Total Bilirubin 06/08/2018 0.4  0.3 - 1.2 mg/dL Final  . GFR, Est Non Af Am 06/08/2018 29* >60 mL/min Final  . GFR, Est AFR Am 06/08/2018 34* >60 mL/min Final   Comment: (NOTE) The eGFR has been calculated using the CKD EPI equation. This calculation has not been validated in all clinical situations. eGFR's persistently <60 mL/min signify possible Chronic Kidney Disease.   Georgiann Hahn gap 06/08/2018 9  5 - 15 Final   Performed at [facility], [place] [address] [place], [place] [address]  . WBC Count 06/08/2018 6.8  3.9 - 10.3 K/uL Final  . RBC 06/08/2018 3.59* 3.70 - 5.45 MIL/uL Final  . Hemoglobin 06/08/2018 11.8  11.6 - 15.9  g/dL Final  . HCT 06/08/2018 34.8  34.8 - 46.6 % Final  . MCV 06/08/2018 97.0  79.5 - 101.0 fL Final  . MCH 06/08/2018 33.0  25.1 - 34.0 pg Final  . MCHC 06/08/2018 34.0  31.5 - 36.0 g/dL Final  . RDW 06/08/2018 12.8  11.2 - 14.5 % Final  . Platelet Count 06/08/2018 300  145 - 400 K/uL Final  . Neutrophils Relative % 06/08/2018 48  % Final  . Neutro Abs 06/08/2018 3.2  1.5 - 6.5 K/uL Final  . Lymphocytes Relative 06/08/2018 39  % Final  . Lymphs Abs 06/08/2018 2.7  0.9 - 3.3 K/uL Final  . Monocytes Relative 06/08/2018 9  % Final  . Monocytes Absolute 06/08/2018 0.6  0.1 - 0.9 K/uL Final  . Eosinophils Relative 06/08/2018 3  % Final  . Eosinophils Absolute 06/08/2018 0.2  0.0 - 0.5 K/uL Final  . Basophils Relative 06/08/2018 1  % Final  . Basophils Absolute 06/08/2018 0.1  0.0 - 0.1 K/uL Final   Performed at [facility], [place] [address] [place], [place] [address]    (this displays the last labs from the last 3 days)  No results found for: TOTALPROTELP, ALBUMINELP, A1GS, A2GS, BETS, BETA2SER, GAMS, MSPIKE, SPEI (this displays SPEP labs)  No results found for: KPAFRELGTCHN, LAMBDASER, KAPLAMBRATIO (kappa/lambda light chains)  No results found for: HGBA, HGBA2QUANT, HGBFQUANT, HGBSQUAN (Hemoglobinopathy evaluation)   No results found for: LDH  No results found for: IRON, TIBC, IRONPCTSAT (Iron and TIBC)  No results found for: FERRITIN  Urinalysis    Component Value Date/Time   COLORURINE YELLOW 11/12/2014 [place] 11/12/2014 1516   LABSPEC 1.015 11/12/2014 1516   PHURINE 6.0 11/12/2014 1516   GLUCOSEU NEGATIVE 11/12/2014 1516   HGBUR NEGATIVE 11/12/2014 1516   BILIRUBINUR NEGATIVE 11/12/2014 1516   KETONESUR NEGATIVE 11/12/2014 1516   PROTEINUR NEGATIVE 11/12/2014 1516   UROBILINOGEN 0.2 11/12/2014 1516   NITRITE NEGATIVE 11/12/2014 1516   LEUKOCYTESUR SMALL (A) 11/12/2014 1516     STUDIES: Mm Breast Surgical  Specimen  Result Date: 05/16/2018 CLINICAL DATA:  Biopsy proven left breast cancer. EXAM: SPECIMEN RADIOGRAPH OF THE LEFT BREAST COMPARISON:  Previous exam(s). FINDINGS: Status post excision of the left breast. The radioactive seed and biopsy marker clip are present, completely intact, and were marked for pathology. IMPRESSION: Specimen radiograph of the left breast. Electronically Signed   By: Lillia Mountain M.D.   On: 05/16/2018 13:03   Mm Lt Radioactive Seed Loc Mammo Guide  Result Date: 05/13/2018 CLINICAL DATA:  Patient presents for radioactive seed localization prior to LEFT lumpectomy for DCIS. EXAM: MAMMOGRAPHIC GUIDED RADIOACTIVE SEED LOCALIZATION OF THE LEFT BREAST COMPARISON:  Previous exam(s). FINDINGS: Patient presents for radioactive seed localization prior to lumpectomy. [name] met with the patient and we discussed the procedure of seed localization including benefits and alternatives. We discussed the high likelihood of a successful procedure. We discussed the risks of the procedure including infection, bleeding, tissue injury and further surgery. We discussed the low dose of radioactivity involved in the procedure. Informed, written consent was given. The usual time-out protocol was performed immediately prior to the procedure. Using mammographic guidance, sterile technique, 1% lidocaine and an [name]-125 radioactive seed, the coil shaped clip in the UPPER-OUTER QUADRANT of the LEFT breast was localized using a superior to inferior approach. The follow-up mammogram images confirm the seed in the expected location and were marked for Dr. Excell [name]. Follow-up survey of the patient confirms presence of the radioactive seed. Order number of [name]-125 seed:  [id number]. Total activity:  2.683 millicurie reference Date: 04/29/2018 The patient tolerated the procedure well and was released from the [facility]. She was given instructions regarding seed removal. IMPRESSION: Radioactive seed localization of the LEFT  breast. No apparent complications. Electronically Signed   By: Nolon Nations M.D.   On: 05/13/2018 16:27    ELIGIBLE FOR AVAILABLE RESEARCH PROTOCOL: no  ASSESSMENT: 84 y.o. [place], [place] woman   (1) status post left lumpectomy and full axillary lymph node dissection (16 lymph nodes removed, all clear) in 1989, followed by adjuvant radiation  (2) status post left breast upper outer quadrant biopsy for ductal carcinoma in situ, estrogen receptor strongly positive, progesterone receptor negative  (3) status post left lumpectomy 05/16/2018 for ductal carcinoma in situ, high-grade, 0.9 cm, with ample margins  (4) anastrozole started 06/08/2018  (a) Bone density: on 10/24/2017 showed a T score of -2.8 /osteoporosis  (5) genetics testing pending  PLAN: [name] spent approximately 50 minutes face to face with Leronda with more than 50% of that time spent in counseling and coordination of care. Specifically we reviewed the biology of the patient's diagnosis and the specifics of her situation. Wandalee understands that in noninvasive ductal carcinoma, also called ductal carcinoma in situ ("DCIS") the breast cancer cells remain trapped in the ducts were they started. They cannot travel to a vital organ. For that reason these cancers in  themselves are not life-threatening.  If the whole breast is removed then all the ducts are removed and since the cancer cells are trapped in the ducts, the cure rate with mastectomy for noninvasive breast cancer is approximately 99%.  This is especially relevant in the patient's case since she cannot have any further adjuvant radiation.  However given her age and comorbidities it was her strong decision to undergo repeat lumpectomy, because there is no survival advantage to mastectomy and because the cosmetic result is generally superior with breast conservation.  Since the patient is keeping her breast, there will be some risk of recurrence. The recurrence can only be in the  same breast since, again, the cells are trapped in the ducts. There is no connection from one breast to the other. The risk of local recurrence can be cut by more than half with radiation, which is however not an option in this case  Instead anti-estrogens should be considered. They will reduce the risk of recurrence by one half. In addition anti-estrogens will lower the risk of a new breast cancer developing in either breast, also by one half. That risk approaches 1% per year otherwise.   We then discussed the difference between tamoxifen and anastrozole in detail. She understands that anastrozole and the aromatase inhibitors in general work by blocking estrogen production. Accordingly vaginal dryness, decrease in bone density, and of course hot flashes can result. The aromatase inhibitors can also negatively affect the cholesterol profile, although that is a minor effect. One out of 5 women on aromatase inhibitors we will feel "old and achy". This arthralgia/myalgia syndrome, which resembles fibromyalgia clinically, does resolve with stopping the medications. Accordingly this is not a reason to not try an aromatase inhibitor but it is a frequent reason to stop it (in other words 20% of women will not be able to tolerate these medications).  Tamoxifen on the other hand does not block estrogen production. It does not "take away a woman's estrogen". It blocks the estrogen receptor in breast cells. Like anastrozole, it can also cause hot flashes. As opposed to anastrozole, tamoxifen has many estrogen-like effects. It is technically an estrogen receptor modulator. This means that in some tissues tamoxifen works like estrogen-- for example it helps strengthen the bones. It tends to improve the cholesterol profile. It can cause thickening of the endometrial lining, and even endometrial polyps or rarely cancer of the uterus.(The risk of uterine cancer due to tamoxifen is one additional cancer per thousand women  year). It can cause vaginal wetness or stickiness. It can cause blood clots through this estrogen-like effect--the risk of blood clots with tamoxifen is exactly the same as with birth control pills or hormone replacement.  Neither of these agents causes mood changes or weight gain, despite the popular belief that they can have these side effects. We have data from studies comparing either of these drugs with placebo, and in those cases the control group had the same amount of weight gain and depression as the group that took the drug.  Because of the concern with blood clots we are staying away from tamoxifen and giving anastrozole a try.  [name] have placed the prescription and she will let us know if she develops any significant problems.  Otherwise she will return to see me in November.  Since she already has osteoporosis, we will discuss bisphosphonates versus denosumab at that visit  The patient also qualifies for genetics testing. In younger patients who carry a deleterious mutation [for  example in a  BRCA gene], the risk of a new breast cancer developing in the future may be sufficiently great that the patient may choose bilateral mastectomies. However if such a patient wishes to keep her breasts in that situation it is safe to do so. That would require intensified screening, which generally means not only yearly mammography but a yearly breast MRI as well.  The patient is particularly interested in genetics testing for her family's sake.  This is being rescheduled.  Ailed has a good understanding of the overall plan. She agrees with it. She knows the goal of treatment in her case is cure. She will call with any problems that may develop before her next visit here.   Tenecia Ignasiak, Karen Dad, MD  06/08/18 4:41 PM Medical Oncology and Hematology [facility] [address] [place], [place] [address] Tel. [phone]    Fax. [phone]  Alice Rieger, am acting as scribe for  Chauncey Cruel MD.  [name], Lurline Del MD, have reviewed the above documentation for accuracy and completeness, and [name] agree with the above.

## 2018-06-08 ENCOUNTER — Inpatient Hospital Stay: Payer: Medicare Other

## 2018-06-08 ENCOUNTER — Inpatient Hospital Stay: Payer: Medicare Other | Attending: Oncology | Admitting: Oncology

## 2018-06-08 VITALS — BP 123/66 | HR 87 | Temp 97.8°F | Resp 18 | Ht 61.75 in | Wt 134.5 lb

## 2018-06-08 DIAGNOSIS — Z801 Family history of malignant neoplasm of trachea, bronchus and lung: Secondary | ICD-10-CM | POA: Diagnosis not present

## 2018-06-08 DIAGNOSIS — Z170 Estrogen receptor positive status [ER+]: Secondary | ICD-10-CM | POA: Diagnosis not present

## 2018-06-08 DIAGNOSIS — E785 Hyperlipidemia, unspecified: Secondary | ICD-10-CM

## 2018-06-08 DIAGNOSIS — I10 Essential (primary) hypertension: Secondary | ICD-10-CM

## 2018-06-08 DIAGNOSIS — I739 Peripheral vascular disease, unspecified: Secondary | ICD-10-CM

## 2018-06-08 DIAGNOSIS — Z803 Family history of malignant neoplasm of breast: Secondary | ICD-10-CM

## 2018-06-08 DIAGNOSIS — M129 Arthropathy, unspecified: Secondary | ICD-10-CM

## 2018-06-08 DIAGNOSIS — C50412 Malignant neoplasm of upper-outer quadrant of left female breast: Secondary | ICD-10-CM

## 2018-06-08 DIAGNOSIS — Z7982 Long term (current) use of aspirin: Secondary | ICD-10-CM

## 2018-06-08 DIAGNOSIS — Z853 Personal history of malignant neoplasm of breast: Secondary | ICD-10-CM | POA: Diagnosis not present

## 2018-06-08 DIAGNOSIS — M810 Age-related osteoporosis without current pathological fracture: Secondary | ICD-10-CM | POA: Diagnosis not present

## 2018-06-08 DIAGNOSIS — Z79899 Other long term (current) drug therapy: Secondary | ICD-10-CM

## 2018-06-08 DIAGNOSIS — M818 Other osteoporosis without current pathological fracture: Secondary | ICD-10-CM

## 2018-06-08 DIAGNOSIS — I442 Atrioventricular block, complete: Secondary | ICD-10-CM

## 2018-06-08 DIAGNOSIS — Z79811 Long term (current) use of aromatase inhibitors: Secondary | ICD-10-CM

## 2018-06-08 DIAGNOSIS — N183 Chronic kidney disease, stage 3 unspecified: Secondary | ICD-10-CM

## 2018-06-08 DIAGNOSIS — D0512 Intraductal carcinoma in situ of left breast: Secondary | ICD-10-CM

## 2018-06-08 DIAGNOSIS — Z923 Personal history of irradiation: Secondary | ICD-10-CM | POA: Diagnosis not present

## 2018-06-08 LAB — CBC WITH DIFFERENTIAL (CANCER CENTER ONLY)
BASOS ABS: 0.1 10*3/uL (ref 0.0–0.1)
Basophils Relative: 1 %
EOS ABS: 0.2 10*3/uL (ref 0.0–0.5)
EOS PCT: 3 %
HCT: 34.8 % (ref 34.8–46.6)
HEMOGLOBIN: 11.8 g/dL (ref 11.6–15.9)
LYMPHS PCT: 39 %
Lymphs Abs: 2.7 10*3/uL (ref 0.9–3.3)
MCH: 33 pg (ref 25.1–34.0)
MCHC: 34 g/dL (ref 31.5–36.0)
MCV: 97 fL (ref 79.5–101.0)
Monocytes Absolute: 0.6 10*3/uL (ref 0.1–0.9)
Monocytes Relative: 9 %
NEUTROS PCT: 48 %
Neutro Abs: 3.2 10*3/uL (ref 1.5–6.5)
PLATELETS: 300 10*3/uL (ref 145–400)
RBC: 3.59 MIL/uL — AB (ref 3.70–5.45)
RDW: 12.8 % (ref 11.2–14.5)
WBC Count: 6.8 10*3/uL (ref 3.9–10.3)

## 2018-06-08 LAB — CMP (CANCER CENTER ONLY)
ALT: 14 U/L (ref 0–44)
AST: 24 U/L (ref 15–41)
Albumin: 3.9 g/dL (ref 3.5–5.0)
Alkaline Phosphatase: 103 U/L (ref 38–126)
Anion gap: 9 (ref 5–15)
BILIRUBIN TOTAL: 0.4 mg/dL (ref 0.3–1.2)
BUN: 24 mg/dL — AB (ref 8–23)
CO2: 30 mmol/L (ref 22–32)
Calcium: 10 mg/dL (ref 8.9–10.3)
Chloride: 101 mmol/L (ref 98–111)
Creatinine: 1.55 mg/dL — ABNORMAL HIGH (ref 0.44–1.00)
GFR, EST AFRICAN AMERICAN: 34 mL/min — AB (ref >60)
GFR, EST NON AFRICAN AMERICAN: 29 mL/min — AB (ref >60)
Glucose, Bld: 109 mg/dL — ABNORMAL HIGH (ref 70–99)
POTASSIUM: 4.3 mmol/L (ref 3.5–5.1)
Sodium: 140 mmol/L (ref 135–145)
TOTAL PROTEIN: 7.6 g/dL (ref 6.5–8.1)

## 2018-06-08 MED ORDER — ANASTROZOLE 1 MG PO TABS: 1.0000 mg | ORAL_TABLET | Freq: Every day | ORAL | 4 refills | Status: DC

## 2018-06-09 ENCOUNTER — Telehealth: Payer: Self-pay | Admitting: Oncology

## 2018-06-09 NOTE — Telephone Encounter (Signed)
Left message - scheduled appt per 8/28 sch message- left message with appt date and time.

## 2018-06-09 NOTE — Telephone Encounter (Signed)
Per 8/28 no los °

## 2018-06-16 ENCOUNTER — Telehealth: Payer: Self-pay | Admitting: *Deleted

## 2018-06-16 NOTE — Telephone Encounter (Signed)
This RN spoke with pt on 9/4 per her call stating onset of " dark almost black stools " .  [name] states she started the anastrazole last week post visit.  This past Monday evening she developed " an upset stomach " .  Tuesday AM she woke with bowel urgency and had episode of incontinence ( not pt's norm ) which was " very watery and dark "  She proceeded thru out the day to have 2 further loose dark stools.  Per call on Wednesday she only had had 1 dark stool that was more formed.  Pt states she has been using Pepto Bismol.  Pt able to rehydrate well and able to eat. Denies any SOB or dizziness.  Above reviewed with MD - may be related to use of Pepto Bismol- need to verify when pt started use of this medication and if needed obtain a CBC.  This RN attempted to call pt multiple times - with no answer x 3 and number busy x 1.  This RN did leave a VM requesting a return call to follow up on symptoms.

## 2018-06-20 ENCOUNTER — Telehealth: Payer: Self-pay | Admitting: Oncology

## 2018-06-20 NOTE — Telephone Encounter (Signed)
Returned pts call to r/s appts   °

## 2018-07-13 ENCOUNTER — Encounter: Payer: Medicare Other | Admitting: Genetic Counselor

## 2018-07-13 ENCOUNTER — Other Ambulatory Visit: Payer: Medicare Other

## 2018-07-15 ENCOUNTER — Telehealth: Payer: Self-pay | Admitting: Cardiology

## 2018-07-15 ENCOUNTER — Ambulatory Visit (INDEPENDENT_AMBULATORY_CARE_PROVIDER_SITE_OTHER): Payer: Medicare Other | Admitting: *Deleted

## 2018-07-15 DIAGNOSIS — I70212 Atherosclerosis of native arteries of extremities with intermittent claudication, left leg: Secondary | ICD-10-CM | POA: Diagnosis not present

## 2018-07-15 NOTE — Telephone Encounter (Signed)
Spoke with pt and reminded pt of remote transmission that is due today. Pt verbalized understanding.   

## 2018-07-17 NOTE — Progress Notes (Signed)
Remote pacemaker transmission.   

## 2018-07-19 ENCOUNTER — Encounter: Payer: Self-pay | Admitting: Genetics

## 2018-07-19 ENCOUNTER — Inpatient Hospital Stay: Payer: Medicare Other | Attending: Oncology | Admitting: Genetics

## 2018-07-19 ENCOUNTER — Inpatient Hospital Stay: Payer: Medicare Other

## 2018-07-19 DIAGNOSIS — Z170 Estrogen receptor positive status [ER+]: Secondary | ICD-10-CM

## 2018-07-19 DIAGNOSIS — Z803 Family history of malignant neoplasm of breast: Secondary | ICD-10-CM | POA: Diagnosis not present

## 2018-07-19 DIAGNOSIS — C50412 Malignant neoplasm of upper-outer quadrant of left female breast: Secondary | ICD-10-CM | POA: Diagnosis not present

## 2018-07-19 DIAGNOSIS — Z801 Family history of malignant neoplasm of trachea, bronchus and lung: Secondary | ICD-10-CM

## 2018-07-19 DIAGNOSIS — Z853 Personal history of malignant neoplasm of breast: Secondary | ICD-10-CM | POA: Diagnosis not present

## 2018-07-19 HISTORY — DX: Personal history of malignant neoplasm of breast: Z85.3

## 2018-07-19 LAB — CUP PACEART REMOTE DEVICE CHECK
Battery Impedance: 161 Ohm
Battery Remaining Longevity: 126 mo
Battery Voltage: 2.79 V
Brady Statistic AP VP Percent: 1 %
Brady Statistic AS VP Percent: 0 %
Date Time Interrogation Session: 20191005174217
Implantable Lead Implant Date: 20160202
Implantable Lead Model: 5076
Implantable Lead Model: 5092
Implantable Pulse Generator Implant Date: 20160202
Lead Channel Impedance Value: 401 Ohm
Lead Channel Pacing Threshold Amplitude: 0.5 V
Lead Channel Pacing Threshold Amplitude: 0.5 V
Lead Channel Pacing Threshold Pulse Width: 0.4 ms
Lead Channel Setting Pacing Amplitude: 2 V
Lead Channel Setting Pacing Amplitude: 2.5 V
Lead Channel Setting Pacing Pulse Width: 0.4 ms
MDC IDC LEAD IMPLANT DT: 20160202
MDC IDC LEAD LOCATION: 753859
MDC IDC LEAD LOCATION: 753860
MDC IDC MSMT LEADCHNL RA PACING THRESHOLD PULSEWIDTH: 0.4 ms
MDC IDC MSMT LEADCHNL RV IMPEDANCE VALUE: 625 Ohm
MDC IDC SET LEADCHNL RV SENSING SENSITIVITY: 4 mV
MDC IDC STAT BRADY AP VS PERCENT: 85 %
MDC IDC STAT BRADY AS VS PERCENT: 13 %

## 2018-07-19 NOTE — Progress Notes (Signed)
REFERRING PROVIDER: Chauncey Cruel, MD [place], [place] [address]  PRIMARY PROVIDER:  Leighton Ruff, MD  PRIMARY REASON FOR VISIT:  1. Malignant neoplasm of upper-outer quadrant of left breast in female, estrogen receptor positive ([place])   2. Family history of breast cancer   3. Family history of lung cancer   4. Personal history of breast cancer      HISTORY OF PRESENT ILLNESS:   [name], a 84 y.o. female, was seen for a [place] cancer genetics consultation at the request of Dr. Jana Hakim due to a personal and family history of breast cancer.  [name] presents to clinic today to discuss the possibility of a hereditary predisposition to cancer, genetic testing, and to further clarify her future cancer risks, as well as potential cancer risks for family members.   In 1989 at the age of 79, [name] was diagnosed with breast cancer and undersent lumpectomy and radiation.  In July 2019, at the age of 69, she was dx with DCIS of hte left breast.  She underwent left lumpectomy on 8/5.2019 and went on antiestrogens.  She reports she is no longer taking these due to a bad reaction to this medication.   CANCER HISTORY:    Malignant neoplasm of upper-outer quadrant of left breast in female, estrogen receptor positive ([facility])   05/25/2018 Initial Diagnosis    Malignant neoplasm of upper-outer quadrant of left breast in female, estrogen receptor positive ([facility])    05/25/2018 Cancer Staging    Staging form: Breast, AJCC 8th Edition - Clinical: Stage 0 (cTis (DCIS), cN0, cM0, ER+, PR-) - Signed by Gardenia Phlegm, NP on 05/25/2018    05/25/2018 Cancer Staging    Staging form: Breast, AJCC 8th Edition - Pathologic: Stage 0 (pTis (DCIS), pN0, cM0, ER+, PR-) - Signed by Gardenia Phlegm, NP on 05/25/2018      HORMONAL RISK FACTORS:  Menarche was at age 32.  First live birth at age 25.  Ovaries intact: yes. 1 intact, 1 removed Hysterectomy: yes.   Menopausal status: postmenopausal.  HRT use: from age [age] years. Colonoscopy: yes; 1 hyperplsatic polyp in 2002. Mammogram within the last year: yes.  Past Medical History:  Diagnosis Date  . Anemia   . Anginal pain ([facility])   . Arthritis   . Breast cancer ([facility] [place])   . Chronic kidney disease   . Complete heart block ([facility])    a. s/p MDT dual chamber pacemaker Dr Rayann Heman  . Family history of breast cancer   . Family history of lung cancer   . Hyperlipidemia   . Hypertension   . Osteoporosis   . Peripheral vascular disease ([facility])   . Personal history of radiation therapy 1985   Left Breast Cancer  . Presence of permanent cardiac pacemaker   . Vertigo     Past Surgical History:  Procedure Laterality Date  . ABDOMINAL HYSTERECTOMY    . APPENDECTOMY    . BREAST LUMPECTOMY Left 1985  . BREAST LUMPECTOMY WITH RADIOACTIVE SEED LOCALIZATION Left 05/16/2018   Procedure: BREAST LUMPECTOMY WITH RADIOACTIVE SEED LOCALIZATION;  Surgeon: Excell Seltzer, MD;  Location: [facility];  Service: General;  Laterality: Left;  . EXCISION MORTON'S NEUROMA Bilateral   . GANGLION CYST EXCISION Left   . JOINT REPLACEMENT Right 2010   knee  . PACEMAKER INSERTION  11/13/14   MDT dual chamber pacemaker implanted by Dr Rayann Heman for CHB  . PERMANENT PACEMAKER INSERTION N/A 11/13/2014   Procedure: PERMANENT  PACEMAKER INSERTION;  Surgeon: Thompson Grayer, MD;  Location[facility];  Service: Cardiovascular;  Laterality: N/A;  . TOTAL HIP ARTHROPLASTY Bilateral     Social History   Socioeconomic History  . Marital status: Divorced    Spouse name: Not on file  . Number of children: Not on file  . Years of education: Not on file  . Highest education level: Not on file  Occupational History  . Not on file  Social Needs  . Financial resource strain: Not on file  . Food insecurity:    Worry: Not on file    Inability: Not on file  . Transportation needs:    Medical: Not on file    Non-medical: Not on file   Tobacco Use  . Smoking status: Never Smoker  . Smokeless tobacco: Never Used  Substance and Sexual Activity  . Alcohol use: Yes    Comment: rarely  . Drug use: No  . Sexual activity: Not on file  Lifestyle  . Physical activity:    Days per week: Not on file    Minutes per session: Not on file  . Stress: Not on file  Relationships  . Social connections:    Talks on phone: Not on file    Gets together: Not on file    Attends religious service: Not on file    Active member of club or organization: Not on file    Attends meetings of clubs or organizations: Not on file    Relationship status: Not on file  Other Topics Concern  . Not on file  Social History Narrative  . Not on file     FAMILY HISTORY:  We obtained a detailed, 4-generation family history.  Significant diagnoses are listed below: Family History  Problem Relation Age of Onset  . Hypertension Mother        possibly per pt  . Hyperlipidemia Mother        possibly per pt  . Transient ischemic attack Mother        multiple  . CVA Mother   . Alcoholism Father   . Other Father 55       cerebral hemorrhage  . Cirrhosis Brother   . Alcoholism Brother   . Cancer Maternal Aunt        lung, hx smoking  . Breast cancer Sister 36  . Breast cancer Sister 50       recurred at 28  . Tuberculosis Paternal Grandfather     [name] has a daughter, [name], who is 89 with no history of cancer.  [name] also has 2 sons ages 35 and 70 with no history of cancer.  [name] has several grandchildren with no cancer.   [name] had 2 sisters and 3 brothers; -1 sister dx with breast cancer at 18 and it recurred at 56.   -1 sister dx with breast cancer at 19 and died at 71 -26 brother died of lung cancer, he had a hx of smoking -1 brother died of cirrhosis of the liver, he had a hx of alcohol abuse -1 brother lived into his 43's with no cancer  [name]'s father: died at 9 due to a cerebral hemorrhage, he had a hx of  smoking, and alcohol use Paternal Aunts/Uncles: 1 paternal aunt and 1 paternal uncle with no hx of cancer Paternal cousins: no hx of cancer Paternal grandfather: deceased due to TB Paternal grandmother:deceased, think she declined in health after a fall  [name] mother: died at 34 due to stroke.  She might have had colon cancer, the patient and her daughter were unsure, but thought maybe she had a history of this.  Maternal Aunts/Uncles: 1 maternal uncle died in a vehicle accident, 2 maternal aunts with no know hx of cancer, but limited info available about these relatives Maternal cousins: no known history of cancer, limited info Maternal grandfather: unk Maternal grandmother:unk  [name] as a nice who had genetic testing that was negative.  Patient's maternal ancestors are of [place] descent, and paternal ancestors are of N. European descent. There is no reported Ashkenazi Jewish ancestry. There is no known consanguinity.  GENETIC COUNSELING ASSESSMENT: [name] is a 84 y.o. female with a personal and family history which is somewhat suggestive of a Hereditary Cancer Predisposition Syndrome. We, therefore, discussed and recommended the following at today's visit.   DISCUSSION: We reviewed the characteristics, features and inheritance patterns of hereditary cancer syndromes. We also discussed genetic testing, including the appropriate family members to test, the process of testing, insurance coverage and turn-around-time for results. We discussed the implications of a negative, positive and/or variant of uncertain significant result. We recommended [name] pursue genetic testing for the Common Hereditary Cancers gene panel.   The Common Hereditary Cancer Panel offered by [facility] includes sequencing and/or deletion duplication testing of the following 55 genes: APC, ATM, AXIN2, BARD1, BLM, BMPR1A, BRCA1, BRCA2, BRIP1, BUB1B, CDH1, CDK4, CDKN2A, CEP57, CHEK2, CTNNA1, DICER1, ENG, EPCAM,  GALNT12, GREM1, HOXB13, KIT, MEN1, MLH1, MLH3, MSH2, MSH3, MSH6, MUTYH, NBN, NF1, NTHL1, PALB2, PDGFRA, PMS2, POLD1, POLE, PTEN, RAD50, RAD51C, RAD51D, RNF43, RPS20, SDHA, SDHB, SDHC, SDHD, SMAD4, SMARCA4, STK11, TP53, TSC1, TSC2, VHL  We discussed that only 5-10% of cancers are associated with a Hereditary cancer predisposition syndrome.  One of the most common hereditary cancer syndromes that increases breast cancer risk is called Hereditary Breast and Ovarian Cancer (HBOC) syndrome.  This syndrome is caused by mutations in the BRCA1 and BRCA2 genes.  This syndrome increases an individual's lifetime risk to develop breast, ovarian, pancreatic, and other types of cancer.  There are also many other cancer predisposition syndromes caused by mutations in several other genes.  We discussed that if she is found to have a mutation in one of these genes, it may impact future medical management recommendations such as increased cancer screenings and consideration of risk reducing surgeries.  A positive result could also have implications for the patient's family members.  A Negative result would mean we were unable to identify a hereditary component to her cancer, but does not rule out the possibility of a hereditary basis for her cancer.  There could be mutations that are undetectable by current technology, or in genes not yet tested or identified to increase cancer risk.    We discussed the potential to find a Variant of Uncertain Significance or VUS.  These are variants that have not yet been identified as pathogenic or benign, and it is unknown if this variant is associated with increased cancer risk or if this is a normal finding.  Most VUS's are reclassified to benign or likely benign.   It should not be used to make medical management decisions. With time, we suspect the lab will determine the significance of any VUS's identified if any.   Based on [name]'s personal and family history of cancer, she  meets medical criteria for genetic testing. Despite that she meets criteria, she may still have an  out of pocket cost. The laboratory can provide her with an estimate of her OOP cost.  she was given the contact information for the laboratory if she has further questions. Marland Kitchen   PLAN: After considering the risks, benefits, and limitations, [name]  provided informed consent to pursue genetic testing and the blood sample was sent to [facility] for analysis of the Common Hereditary Cancers panel. Results should be available within approximately 2-3 weeks' time, at which point they will be disclosed by telephone to [name], as will any additional recommendations warranted by these results. [name] will receive a summary of her genetic counseling visit and a copy of her results once available. This information will also be available in Epic. We encouraged [name] to remain in contact with cancer genetics annually so that we can continuously update the family history and inform her of any changes in cancer genetics and testing that may be of benefit for her family. Ms. Vandall questions were answered to her satisfaction today. Our contact information was provided should additional questions or concerns arise.  Lastly, we encouraged Ms. Mackiewicz to remain in contact with cancer genetics annually so that we can continuously update the family history and inform her of any changes in cancer genetics and testing that may be of benefit for this family.   Ms.  Veals questions were answered to her satisfaction today. Our contact information was provided should additional questions or concerns arise. Thank you for the referral and allowing [place] to share in the care of your patient.   Tana Felts, MS, [facility] Certified Genetic Counselor [email or web address] phone: [phone]  The patient was seen for a total of 40 minutes in face-to-face genetic counseling.  The patient was accompanied today by  her daughter, [name]. This patient was discussed with Drs. Magrinat, Lindi Adie and/or Burr Medico who agrees with the above.

## 2018-07-21 DIAGNOSIS — C50919 Malignant neoplasm of unspecified site of unspecified female breast: Secondary | ICD-10-CM | POA: Insufficient documentation

## 2018-07-21 NOTE — Progress Notes (Signed)
[name] A  Date of visit:  04/28/2018 DOB:  1928-11-19    Age:  84 yrs. Medical record number:  [id number]     Account number:  [id number] Primary Care Provider: [facility] ____________________________ CURRENT DIAGNOSES  1. Chest pain  2. Encounter for preprocedural cardiovascular examination  3. Abnormal electrocardiogram [ECG]  4. Hyperlipidemia  5. Essential (primary) hypertension  6. Chest pain  7. Presence of cardiac pacemaker ____________________________ ALLERGIES  Ace Inhibitors, Intolerance-unknown  Cardizem, Intolerance-unknown  Cozaar, Headache ____________________________ MEDICATIONS  1. aspirin 81 mg tablet,delayed release, 1 p.o. daily  2. atorvastatin 20 mg tablet, 1 p.o. daily  3. Calcium 600 + D(3) 600 mg (1,500 mg)-400 unit tablet, 2 p.o. daily  4. clopidogrel 75 mg tablet, 1 p.o. daily  5. coenzyme Q10 (ubiquinol) 100 mg capsule, 1 p.o. daily  6. fexofenadine 180 mg tablet, PRN  7. loratadine 10 mg tablet, 1 p.o. daily  8. losartan 50 mg tablet, 1 tablet p.o. daily  9. magnesium 250 mg tablet, 500mg  qd  10. melatonin 3 mg tablet, QHS  11. metoprolol succinate ER 25 mg tablet,extended release 24 hr, 1 tablet p.o. daily  12. multivitamin tablet, 1 p.o. daily  13. nitroglycerin 0.4 mg sublingual tablet, Take as directed  14. potassium gluconate 550 mg (90 mg) tablet, 1 p.o. daily  15. tramadol 50 mg tablet, PRN  16. Tylenol Extra Strength 500 mg tablet, PRN  17. Vitamin D3 1,000 unit capsule, 1 p.o. daily ____________________________ CHIEF COMPLAINTS  Preop cardiac evaluation ____________________________ HISTORY OF PRESENT ILLNESS  Patient returns early for followup. She has been diagnosed with breast cancer her since she was here and is seen for preoperative evaluation. I saw her a while back and placed her on Plavix. When she had her breast biopsy they stopped her aspirin and visual but did not stop her Plavix and she had a good bit of bleeding  with that. She has seen the general surgeon who recommended genetic studies and oncology referral and wants to do operation. The choice was either between a lumpectomy or mastectomy. She previously was seen with some chest discomfort and had some new T wave changes inferiorly. Because of some renal insufficiency we intensively treated her medically and she has not had significant chest discomfort since then. She has been able to do most of her normal activities but is somewhat limited at her age. She has been using beta blockers. She denies PND, orthopnea or edema. ____________________________ PAST HISTORY  Past Medical Illnesses:  hypertension, hyperlipidemia, breast cancer treated with surgery and radiation 1968;  Cardiovascular Illnesses:  sick sinus syndrome;  Infectious Diseases:  no previous history of significant infectious diseases;  Surgical Procedures:  appendectomy, breast lumpectomy, hysterectomy-subtotal, hip replacement-bil, knee replacement-rt;  Trauma History:  no previous history of significant trauma;  NYHA Classification:  I;  Canadian Angina Classification:  Class 0: Asymptomatic;  Cardiology Procedures-Invasive:  [employer] pacemaker implant;  Cardiology Procedures-Noninvasive:  echocardiogram;  Peripheral Vascular Procedures:  no previous invasive peripheral vascular procedures.;  LVEF of 60% documented via echocardiogram on 11/13/2014,   ____________________________ CARDIO-PULMONARY TEST DATES EKG Date:  04/28/2018;  Nuclear Study Date:  12/17/2005;  Echocardiography Date: 11/13/2014;   ____________________________ FAMILY HISTORY Brother -- Brother dead, Death of unknown cause Brother -- Brother dead Brother -- Brother dead, Malignant neoplasm of lung Father -- Father dead, Cerebrovascular accident Mother -- Mother dead, CVA Sister -- Sister dead, Carcinoma of breast Sister -- Sister dead, Carcinoma of breast ____________________________ SOCIAL HISTORY Alcohol Use:  rare;   Smoking:  never smoked;  Diet:  regular diet and caffeine use-1-2 per day;  Lifestyle:  divorced, 2 sons and 1 daughter;  Exercise:  no regular exercise;  Occupation:  retired and Statistician;  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 Eyes: wears eye glasses/contact lenses, denies diplopia, glaucoma or visual field defects. Respiratory: denies dyspnea, cough, wheezing or hemoptysis. Cardiovascular:  please review HPI Genitourinary-Female: frequency Musculoskeletal:  chronic low back pain, arthritis of the knees, aching in left hip, arthritis of the right shoulder Neurological:  vertigo  ____________________________ PHYSICAL EXAMINATION VITAL SIGNS  Blood Pressure:  130/70 Sitting, Left arm, regular cuff  , 140/70 Standing, Left arm and regular cuff   Pulse:  84/min. Weight:  136.00 lbs. Height:  61"BMI: 25  Constitutional:  pleasant white female, in no acute distress Skin:  warm and dry to touch, no apparent skin lesions, or masses noted. Head:  normocephalic, normal hair pattern, no masses or tenderness Neck:  supple, without massess. No JVD, thyromegaly or carotid bruits. Carotid upstroke normal. Chest:  normal symmetry, clear to auscultation, healed pacemaker incision in the left pectoral area Cardiac:  regular rhythm, normal S1 and S2, No S3 or S4, no murmurs, gallops or rubs detected. Peripheral Pulses:  the femoral,dorsalis pedis, and posterior tibial pulses are full and equal bilaterally with no bruits auscultated. Extremities & Back:  Healed scar from knee surgery right leg, no edema present Neurological:  no gross motor or sensory deficits noted, affect appropriate, oriented x3. ____________________________ MOST RECENT LIPID PANEL 11/19/16  CHOL TOTL 142 mg/dl, LDL 57 NM, HDL 52 mg/dl and TRIGLYCER 166 mg/dl ____________________________ IMPRESSIONS/PLAN  1. From a  cardiovascular viewpoint I think that she could proceed with a lumpectomy safely. Ideally I would like for her to continue aspirin but she can discontinue Plavix 5 days prior to the surgery. I think at this point she has been stable and her EKG continues to so some inferolateral T wave changes. There is a slight increased risk to the operation but this is a more limited procedure than a total mastectomy and I think it would be important to go ahead and get her breast cancer treatment underway and defined. If she continues to have chest discomfort we can consider intervention or other things afterwards. At the present time since she is asymptomatic and EKG remained stable I think we can proceed with this course 2. Probable angina no recently diagnosed 3. Hypertension 4. Hyperlipidemia  Recommendations:  See recommendations as above. May discontinue Plavix 5 days prior to planned surgery but not before. I would start that back fairly soon afterwards. Continue her metoprolol at the time of surgery. I would be happy to see her if needed at the time of surgery. ____________________________ TODAYS ORDERS  1. 12 Lead EKG: Today  2. Return Visit: keep appt                       ____________________________ Cardiology Physician:  Kerry Hough MD [facility]

## 2018-07-22 DIAGNOSIS — I209 Angina pectoris, unspecified: Secondary | ICD-10-CM | POA: Insufficient documentation

## 2018-07-22 HISTORY — DX: Angina pectoris, unspecified: I20.9

## 2018-07-22 NOTE — Progress Notes (Signed)
[name] A  Date of visit:  04/28/2018 DOB:  05-May-1929    Age:  84 yrs. Medical record number:  [id number]     Account number:  [id number] Primary Care Provider: [facility] ____________________________ CURRENT DIAGNOSES  1. Chest pain  2. Encounter for preprocedural cardiovascular examination  3. Abnormal electrocardiogram [ECG]  4. Hyperlipidemia  5. Essential (primary) hypertension  6. Chest pain  7. Presence of cardiac pacemaker ____________________________ ALLERGIES  Ace Inhibitors, Intolerance-unknown  Cardizem, Intolerance-unknown  Cozaar, Headache ____________________________ MEDICATIONS  1. aspirin 81 mg tablet,delayed release, 1 p.o. daily  2. atorvastatin 20 mg tablet, 1 p.o. daily  3. Calcium 600 + D(3) 600 mg (1,500 mg)-400 unit tablet, 2 p.o. daily  4. clopidogrel 75 mg tablet, 1 p.o. daily  5. coenzyme Q10 (ubiquinol) 100 mg capsule, 1 p.o. daily  6. fexofenadine 180 mg tablet, PRN  7. loratadine 10 mg tablet, 1 p.o. daily  8. losartan 50 mg tablet, 1 tablet p.o. daily  9. magnesium 250 mg tablet, 500mg  qd  10. melatonin 3 mg tablet, QHS  11. metoprolol succinate ER 25 mg tablet,extended release 24 hr, 1 tablet p.o. daily  12. multivitamin tablet, 1 p.o. daily  13. nitroglycerin 0.4 mg sublingual tablet, Take as directed  14. potassium gluconate 550 mg (90 mg) tablet, 1 p.o. daily  15. tramadol 50 mg tablet, PRN  16. Tylenol Extra Strength 500 mg tablet, PRN  17. Vitamin D3 1,000 unit capsule, 1 p.o. daily ____________________________ CHIEF COMPLAINTS  Preop cardiac evaluation ____________________________ HISTORY OF PRESENT ILLNESS  Patient returns early for followup. She has been diagnosed with breast cancer her since she was here and is seen for preoperative evaluation. I saw her a while back and placed her on Plavix. When she had her breast biopsy they stopped her aspirin and visual but did not stop her Plavix and she had a good bit of bleeding  with that. She has seen the general surgeon who recommended genetic studies and oncology referral and wants to do operation. The choice was either between a lumpectomy or mastectomy. She previously was seen with some chest discomfort and had some new T wave changes inferiorly. Because of some renal insufficiency we intensively treated her medically and she has not had significant chest discomfort since then. She has been able to do most of her normal activities but is somewhat limited at her age. She has been using beta blockers. She denies PND, orthopnea or edema. ____________________________ PAST HISTORY  Past Medical Illnesses:  hypertension, hyperlipidemia, breast cancer treated with surgery and radiation 1968;  Cardiovascular Illnesses:  sick sinus syndrome;  Infectious Diseases:  no previous history of significant infectious diseases;  Surgical Procedures:  appendectomy, breast lumpectomy, hysterectomy-subtotal, hip replacement-bil, knee replacement-rt;  Trauma History:  no previous history of significant trauma;  NYHA Classification:  I;  Canadian Angina Classification:  Class 0: Asymptomatic;  Cardiology Procedures-Invasive:  Medtronic pacemaker implant;  Cardiology Procedures-Noninvasive:  echocardiogram;  Peripheral Vascular Procedures:  no previous invasive peripheral vascular procedures.;  LVEF of 60% documented via echocardiogram on 11/13/2014,   ____________________________ CARDIO-PULMONARY TEST DATES EKG Date:  04/28/2018;  Nuclear Study Date:  12/17/2005;  Echocardiography Date: 11/13/2014;   ____________________________ FAMILY HISTORY Brother -- Brother dead, Death of unknown cause Brother -- Brother dead Brother -- Brother dead, Malignant neoplasm of lung Father -- Father dead, Cerebrovascular accident Mother -- Mother dead, CVA Sister -- Sister dead, Carcinoma of breast Sister -- Sister dead, Carcinoma of breast ____________________________ SOCIAL HISTORY Alcohol Use:  rare;   Smoking:  never smoked;  Diet:  regular diet and caffeine use-1-2 per day;  Lifestyle:  divorced, 2 sons and 1 daughter;  Exercise:  no regular exercise;  Occupation:  retired and Statistician;  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 Eyes: wears eye glasses/contact lenses, denies diplopia, glaucoma or visual field defects. Respiratory: denies dyspnea, cough, wheezing or hemoptysis. Cardiovascular:  please review HPI Genitourinary-Female: frequency Musculoskeletal:  chronic low back pain, arthritis of the knees, aching in left hip, arthritis of the right shoulder Neurological:  vertigo  ____________________________ PHYSICAL EXAMINATION VITAL SIGNS  Blood Pressure:  130/70 Sitting, Left arm, regular cuff  , 140/70 Standing, Left arm and regular cuff   Pulse:  84/min. Weight:  136.00 lbs. Height:  61"BMI: 25  Constitutional:  pleasant white female, in no acute distress Skin:  warm and dry to touch, no apparent skin lesions, or masses noted. Head:  normocephalic, normal hair pattern, no masses or tenderness Neck:  supple, without massess. No JVD, thyromegaly or carotid bruits. Carotid upstroke normal. Chest:  normal symmetry, clear to auscultation, healed pacemaker incision in the left pectoral area Cardiac:  regular rhythm, normal S1 and S2, No S3 or S4, no murmurs, gallops or rubs detected. Peripheral Pulses:  the femoral,dorsalis pedis, and posterior tibial pulses are full and equal bilaterally with no bruits auscultated. Extremities & Back:  Healed scar from knee surgery right leg, no edema present Neurological:  no gross motor or sensory deficits noted, affect appropriate, oriented x3. ____________________________ MOST RECENT LIPID PANEL 11/19/16  CHOL TOTL 142 mg/dl, LDL 57 NM, HDL 52 mg/dl and TRIGLYCER 166 mg/dl ____________________________ IMPRESSIONS/PLAN  1. From a  cardiovascular viewpoint I think that she could proceed with a lumpectomy safely. Ideally I would like for her to continue aspirin but she can discontinue Plavix 5 days prior to the surgery. I think at this point she has been stable and her EKG continues to so some inferolateral T wave changes. There is a slight increased risk to the operation but this is a more limited procedure than a total mastectomy and I think it would be important to go ahead and get her breast cancer treatment underway and defined. If she continues to have chest discomfort we can consider intervention or other things afterwards. At the present time since she is asymptomatic and EKG remained stable I think we can proceed with this course 2. Probable angina no recently diagnosed 3. Hypertension 4. Hyperlipidemia  Recommendations:  See recommendations as above. May discontinue Plavix 5 days prior to planned surgery but not before. I would start that back fairly soon afterwards. Continue her metoprolol at the time of surgery. I would be happy to see her if needed at the time of surgery. ____________________________ TODAYS ORDERS  1. 12 Lead EKG: Today  2. Return Visit: keep appt                       ____________________________ Cardiology Physician:  Kerry Hough MD [facility]

## 2018-07-22 NOTE — Progress Notes (Signed)
[name] A  Date of visit:  03/28/2018 DOB:  08-Jun-1929    Age:  84 yrs. Medical record number:  [id number]     Account number:  [id number] Primary Care Provider: [facility] ____________________________ CURRENT DIAGNOSES  1. Chest pain  2. Essential (primary) hypertension  3. Abnormal electrocardiogram [ECG]  4. Hyperlipidemia ____________________________ ALLERGIES  Ace Inhibitors, Intolerance-unknown  Cardizem, Intolerance-unknown  Cozaar, Headache ____________________________ MEDICATIONS  1. aspirin 81 mg tablet,delayed release, 1 p.o. daily  2. atorvastatin 20 mg tablet, 1 p.o. daily  3. Calcium 600 + D(3) 600 mg (1,500 mg)-400 unit tablet, 2 p.o. daily  4. clopidogrel 75 mg tablet, 1 p.o. daily  5. coenzyme Q10 (ubiquinol) 100 mg capsule, 1 p.o. daily  6. Fish Oil 300 mg-1,000 mg capsule, QHS  7. flaxseed oil 1,000 mg capsule, 1 p.o. daily  8. Herbal Tablet [No Strength], CBD oil PRN  9. loratadine 10 mg tablet, 1 p.o. daily  10. losartan 50 mg tablet, 1 tablet p.o. daily  11. magnesium 250 mg tablet, 500mg  qd  12. melatonin 3 mg tablet, QHS  13. metoprolol succinate ER 25 mg tablet,extended release 24 hr, 1 tablet p.o. daily  14. multivitamin tablet, 1 p.o. daily  15. nitroglycerin 0.4 mg sublingual tablet, Take as directed  16. tramadol 50 mg tablet, PRN  17. Tylenol Extra Strength 500 mg tablet, PRN  18. Vitamin D3 1,000 unit capsule, 1 p.o. daily ____________________________ HISTORY OF PRESENT ILLNESS Patient returns for cardiac followup. Echocardiogram today shows good LV function with no wall motion abnormalities. She has not had any recurrence of chest discomfort since she was here and feels relatively well. She denies PND orthopnea or edema. She complains of a knot on her left side if she bends over it feels like a leg cramp it is different than the episodes that brought her in previously. She has been taking a higher dose of the metoprolol. Denies TIA or  claudication. ____________________________ PAST HISTORY  Past Medical Illnesses:  hypertension, hyperlipidemia, breast cancer treated with surgery and radiation 1968;  Cardiovascular Illnesses:  sick sinus syndrome;  Infectious Diseases:  no previous history of significant infectious diseases;  Surgical Procedures:  appendectomy, breast lumpectomy, hysterectomy-subtotal, hip replacement-bil, knee replacement-rt;  Trauma History:  no previous history of significant trauma;  NYHA Classification:  I;  Canadian Angina Classification:  Class 0: Asymptomatic;  Cardiology Procedures-Invasive:  Medtronic pacemaker implant;  Cardiology Procedures-Noninvasive:  echocardiogram;  Peripheral Vascular Procedures:  no previous invasive peripheral vascular procedures.;  LVEF of 60% documented via echocardiogram on 11/13/2014,   ____________________________ SOCIAL HISTORY Alcohol Use:  rare;  Smoking:  never smoked;  Diet:  regular diet and caffeine use-1-2 per day;  Lifestyle:  divorced, 2 sons and 1 daughter;  Exercise:  no regular exercise;  Occupation:  retired and Statistician;  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 Eyes: wears eye glasses/contact lenses, denies diplopia, glaucoma or visual field defects. Respiratory: denies dyspnea, cough, wheezing or hemoptysis. Cardiovascular:  please review HPI Genitourinary-Female: frequency Musculoskeletal:  chronic low back pain, arthritis of the knees, aching in left hip, arthritis of the right shoulder Neurological:  vertigo  ____________________________ PHYSICAL EXAMINATION VITAL SIGNS  Blood Pressure:  110/70 Sitting, Left arm, regular cuff  , 120/70 Standing, Left arm and regular cuff   Pulse:  80/min. Weight:  132.00 lbs. Height:  61"BMI: 25  Constitutional:  pleasant white female, in no  acute distress Skin:  warm and dry to touch, no apparent skin  lesions, or masses noted. Head:  normocephalic, normal hair pattern, no masses or tenderness Neck:  supple, without massess. No JVD, thyromegaly or carotid bruits. Carotid upstroke normal. Chest:  normal symmetry, clear to auscultation, healed pacemaker incision in the left pectoral area Cardiac:  regular rhythm, normal S1 and S2, No S3 or S4, no murmurs, gallops or rubs detected. Peripheral Pulses:  the femoral,dorsalis pedis, and posterior tibial pulses are full and equal bilaterally with no bruits auscultated. Extremities & Back:  Healed scar from knee surgery right leg, no edema present Neurological:  no gross motor or sensory deficits noted, affect appropriate, oriented x3. ____________________________ MOST RECENT LIPID PANEL 11/19/16  CHOL TOTL 142 mg/dl, LDL 57 NM, HDL 52 mg/dl and TRIGLYCER 166 mg/dl ____________________________ IMPRESSIONS/PLAN  1. Chest discomfort that has resolved associated with inferolateral T-wave inversions that could have been angina 2. Hypertension 3. Hyperlipidemia 4. Functioning pacemaker  Recommendations:  She also has chronic kidney disease with a creatinine of 1.4. She is almost 49. I'm going to intensify medical therapy first in light of her renal insufficiency. Continue metoprolol 25 mg and increase atorvastatin 20 mg daily. At Plavix 75 mg to regimen and may go back to doing light training exercise. She has nitroglycerin on hand and is to let us know if she has recurrent symptoms. If so I think we'll probably need to do cath.  ____________________________ TODAYS ORDERS  1. Return Visit: 1 month                       ____________________________ Cardiology Physician:  Kerry Hough MD [facility]

## 2018-07-22 NOTE — Progress Notes (Signed)
[name] A  Date of visit:  03/22/2018 DOB:  1929-07-23    Age:  84 yrs. Medical record number:  [id number]     Account number:  [id number] Primary Care Provider: [facility] ____________________________ CURRENT DIAGNOSES  1. Chest pain  2. Abnormal electrocardiogram [ECG]  3. Essential (primary) hypertension  4. Hyperlipidemia  5. Presence of cardiac pacemaker ____________________________ ALLERGIES  Ace Inhibitors, Intolerance-unknown  Cardizem, Intolerance-unknown  Cozaar, Headache ____________________________ MEDICATIONS  1. aspirin 81 mg tablet,delayed release, 1 p.o. daily  2. atorvastatin 10 mg tablet, 1 p.o. daily  3. Calcium 600 + D(3) 600 mg (1,500 mg)-400 unit tablet, 2 p.o. daily  4. coenzyme Q10 (ubiquinol) 100 mg capsule, 1 p.o. daily  5. Fish Oil 300 mg-1,000 mg capsule, QHS  6. flaxseed oil 1,000 mg capsule, 1 p.o. daily  7. Herbal Tablet [No Strength], CBD oil PRN  8. loratadine 10 mg tablet, 1 p.o. daily  9. losartan 50 mg tablet, 1 tablet p.o. daily  10. magnesium 250 mg tablet, 500mg  qd  11. melatonin 3 mg tablet, QHS  12. metoprolol succinate ER 25 mg tablet,extended release 24 hr, 1 tablet p.o. daily  13. multivitamin tablet, 1 p.o. daily  14. nitroglycerin 0.4 mg sublingual tablet, Take as directed  15. tramadol 50 mg tablet, PRN  16. Tylenol Extra Strength 500 mg tablet, PRN  17. Vitamin D3 1,000 unit capsule, 1 p.o. daily ____________________________ CHIEF COMPLAINTS New onset of chest pain  Followup of Hyperlipidemia  Followup of Presence of cardiac pacemaker ____________________________ HISTORY OF PRESENT ILLNESS Patient returns early for evaluation of chest discomfort. She had an episode of couple of weeks ago were at rest she developed midsternal and lower sternal discomfort that radiated through to her back and lasted around 10-15 minutes. She did not sweat but was mildly short of breath with the discomfort. She had another episode that  occurred last Thursday that was again at rest and while she was getting ready to go to bed. It did not radiate to her arms and was not pleuritic. It is not associated with activity. She does have a vague sensation she walks that she may have some vague aching in her chest. She has not had previous symptoms like this before. She denies PND, orthopnea, syncope, or claudication. ____________________________ PAST HISTORY  Past Medical Illnesses:  hypertension, hyperlipidemia, breast cancer treated with surgery and radiation 1968;  Cardiovascular Illnesses:  sick sinus syndrome;  Infectious Diseases:  no previous history of significant infectious diseases;  Surgical Procedures:  appendectomy, breast lumpectomy, hysterectomy-subtotal, hip replacement-bil, knee replacement-rt;  Trauma History:  no previous history of significant trauma;  NYHA Classification:  I;  Canadian Angina Classification:  Class 0: Asymptomatic;  Cardiology Procedures-Invasive:  Medtronic pacemaker implant;  Cardiology Procedures-Noninvasive:  echocardiogram;  Peripheral Vascular Procedures:  no previous invasive peripheral vascular procedures.;  LVEF of 60% documented via echocardiogram on 11/13/2014,   ____________________________ CARDIO-PULMONARY TEST DATES EKG Date:  03/22/2018;  Nuclear Study Date:  12/17/2005;  Echocardiography Date: 11/13/2014;   ____________________________ FAMILY HISTORY Brother -- Brother dead, Death of unknown cause Brother -- Brother dead Brother -- Brother dead, Malignant neoplasm of lung Father -- Father dead, Cerebrovascular accident Mother -- Mother dead, CVA Sister -- Sister dead, Carcinoma of breast Sister -- Sister dead, Carcinoma of breast ____________________________ SOCIAL HISTORY Alcohol Use:  rare;  Smoking:  used to smoke but quit Prior to 1980;  Diet:  regular diet and caffeine use-1-2 per day;  Lifestyle:  divorced, 2 sons and  1 daughter;  Exercise:  no regular exercise;  Occupation:   retired and Statistician;  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 Eyes: wears eye glasses/contact lenses, denies diplopia, glaucoma or visual field defects. Ears, Nose, Throat, Mouth:  denies any hearing loss, epistaxis, hoarseness or difficulty speaking. Respiratory: denies dyspnea, cough, wheezing or hemoptysis. Cardiovascular:  please review HPI Abdominal: denies dyspepsia, GI bleeding, constipation, or diarrheaGenitourinary-Female: frequency Musculoskeletal:  chronic low back pain, arthritis of the knees, aching in left hip, arthritis of the right shoulder Neurological:  vertigo  ____________________________ PHYSICAL EXAMINATION VITAL SIGNS  Blood Pressure:  110/74 Sitting, Right arm, regular cuff  , 126/80 Standing, Right arm and regular cuff   Pulse:  68/min. Weight:  132.00 lbs. Height:  61"BMI: 25  Constitutional:  pleasant white female, in no acute distress Skin:  warm and dry to touch, no apparent skin lesions, or masses noted. Head:  normocephalic, normal hair pattern, no masses or tenderness Eyes:  EOMS Intact, PERRLA, C and S clear, Funduscopic exam not done. ENT:  ears, nose and throat reveal no gross abnormalities.  Dentition good. Neck:  supple, without massess. No JVD, thyromegaly or carotid bruits. Carotid upstroke normal. Chest:  normal symmetry, clear to auscultation, healed pacemaker incision in the left pectoral area Cardiac:  regular rhythm, normal S1 and S2, No S3 or S4, no murmurs, gallops or rubs detected. Abdomen:  abdomen soft,non-tender, no masses, no hepatospenomegaly, or aneurysm noted Peripheral Pulses:  the femoral,dorsalis pedis, and posterior tibial pulses are full and equal bilaterally with no bruits auscultated. Extremities & Back:  Healed scar from knee surgery right leg, no edema present Neurological:  no gross motor or sensory deficits  noted, affect appropriate, oriented x3. ____________________________ MOST RECENT LIPID PANEL 11/19/16  CHOL TOTL 142 mg/dl, LDL 57 NM, HDL 52 mg/dl and TRIGLYCER 166 mg/dl ____________________________ IMPRESSIONS/PLAN 1. Chest discomfort that may be suggestive of unstable angina 2. Abnormal EKG with new inferolateral T-wave inversions are 3. Hypertension 4. Functioning permanent pacemaker  Recommendations:  Twelve-lead EKG personally reviewed shows inferolateral T wave inversions which are new consistent with ischemia. I am concerned this may represent angina. I asked her to increase her metoprolol to 25 mg daily and gave her a prescription for nitroglycerin to use. I would like for her to have an echocardiogram to evaluate her LV function and had lab work as noted below. She should return in one week and she should go to the emergency room if she has chest discomfort unrelieved with nitroglycerin.  Greater than 40 minutes spent with patient including >50% of the time spent face-to-face including coordination of care/counseling regarding unstable angina, diagnosis of heart disease and a workup of abnormal EKG.   ____________________________ TODAYS ORDERS  1. 2D, color flow, doppler: At Patient Convenience  2. Comprehensive Metabolic Panel: Today  3. TSH: Today  4. Complete Blood Count: Today  5. 12 Lead EKG: Today  6. Return Visit: 1 week                       ____________________________ Cardiology Physician:  Kerry Hough MD [facility]

## 2018-07-29 ENCOUNTER — Telehealth: Payer: Self-pay | Admitting: Genetics

## 2018-07-29 NOTE — Telephone Encounter (Signed)
Revealed negative genetic testing.  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  It is unlikely that there is an increased risk of another cancer due to a mutation in one of these genes.  However, genetic testing is not perfect, and cannot definitively rule out a hereditary cause.  It will be important for her to keep in contact with genetics to learn if any additional testing may be needed in the future.     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  It is inherited recessively (a person must have 2 mutations to be affected)- therefore if her son had this it would mean that she and his father would have been carriers.  We recommended if they want to pursue workup/testing for this that it may be best to start with her son who is having the symptoms and we suggested an adult genetics clinic or a metabolic clinic(suggested [place]).  Her son's GI doctor may also be able to suggest an appropriate referral.

## 2018-08-01 ENCOUNTER — Encounter: Payer: Self-pay | Admitting: Genetics

## 2018-08-01 ENCOUNTER — Ambulatory Visit: Payer: Self-pay | Admitting: Genetics

## 2018-08-01 DIAGNOSIS — Z170 Estrogen receptor positive status [ER+]: Secondary | ICD-10-CM

## 2018-08-01 DIAGNOSIS — C50412 Malignant neoplasm of upper-outer quadrant of left female breast: Secondary | ICD-10-CM

## 2018-08-01 DIAGNOSIS — Z801 Family history of malignant neoplasm of trachea, bronchus and lung: Secondary | ICD-10-CM

## 2018-08-01 DIAGNOSIS — Z1379 Encounter for other screening for genetic and chromosomal anomalies: Secondary | ICD-10-CM

## 2018-08-01 DIAGNOSIS — Z853 Personal history of malignant neoplasm of breast: Secondary | ICD-10-CM

## 2018-08-01 DIAGNOSIS — Z803 Family history of malignant neoplasm of breast: Secondary | ICD-10-CM

## 2018-08-01 HISTORY — DX: Encounter for other screening for genetic and chromosomal anomalies: Z13.79

## 2018-08-01 NOTE — Progress Notes (Signed)
HPI:  Ms. Boyden was previously seen in the [place] clinic on 07/19/2018 due to a personal and family history of breast cancer and concerns regarding a hereditary predisposition to cancer. Please refer to our prior cancer genetics clinic note for more information regarding Ms. Arbogast's medical, social and family histories, and our assessment and recommendations, at the time. Ms. Vosler recent genetic test results were disclosed to her, as well as recommendations warranted by these results. These results and recommendations are discussed in more detail below.  CANCER HISTORY:    Malignant neoplasm of upper-outer quadrant of left breast in female, estrogen receptor positive ([place])   05/25/2018 Initial Diagnosis    Malignant neoplasm of upper-outer quadrant of left breast in female, estrogen receptor positive ([place])    05/25/2018 Cancer Staging    Staging form: Breast, AJCC 8th Edition - Clinical: Stage 0 (cTis (DCIS), cN0, cM0, ER+, PR-) - Signed by Gardenia Phlegm, NP on 05/25/2018    05/25/2018 Cancer Staging    Staging form: Breast, AJCC 8th Edition - Pathologic: Stage 0 (pTis (DCIS), pN0, cM0, ER+, PR-) - Signed by Gardenia Phlegm, NP on 05/25/2018    07/25/2018 Genetic Testing    The Common Hereditary Cancer Panel offered by [facility] includes sequencing and/or deletion duplication testing of the following 55 genes: APC, ATM, AXIN2, BARD1, BLM, BMPR1A, BRCA1, BRCA2, BRIP1, BUB1B, CDH1, CDK4, CDKN2A, CEP57, CHEK2, CTNNA1, DICER1, ENG, EPCAM, GALNT12, GREM1, HOXB13, KIT, MEN1, MLH1, MLH3, MSH2, MSH3, MSH6, MUTYH, NBN, NF1, NTHL1, PALB2, PDGFRA, PMS2, POLD1, POLE, PTEN, RAD50, RAD51C, RAD51D, RNF43, RPS20, SDHA, SDHB, SDHC, SDHD, SMAD4, SMARCA4, STK11, TP53, TSC1, TSC2, VHL  Results: No pathogenic variants identified.  2 Variants of uncertain significance in the genes ATM c.8268+6T>A (Intronic) and POLE c.6767G>C (p.Gly2256Ala) were identified.  The date of this test  report is 07/25/2018.       FAMILY HISTORY:  We obtained a detailed, 4-generation family history.  Significant diagnoses are listed below: Family History  Problem Relation Age of Onset  . Hypertension Mother        possibly per pt  . Hyperlipidemia Mother        possibly per pt  . Transient ischemic attack Mother        multiple  . CVA Mother   . Alcoholism Father   . Other Father 2       cerebral hemorrhage  . Cirrhosis Brother   . Alcoholism Brother   . Cancer Maternal Aunt        lung, hx smoking  . Breast cancer Sister 37  . Breast cancer Sister 13       recurred at 77  . Tuberculosis Paternal Grandfather     Ms. Bias has a daughter, Collie Siad, who is 65 with no history of cancer.  Ms. Sarchet also has 2 sons ages 71 and 29 with no history of cancer.  Ms. Southard has several grandchildren with no cancer.   Ms. Graefe had 2 sisters and 3 brothers; -1 sister dx with breast cancer at 19 and it recurred at 74.   -1 sister dx with breast cancer at 69 and died at 70 -58 brother died of lung cancer, he had a hx of smoking -1 brother died of cirrhosis of the liver, he had a hx of alcohol abuse -1 brother lived into his 70's with no cancer  Ms. Criscione's father: died at 3 due to a cerebral hemorrhage, he had a hx of smoking, and alcohol use  Paternal Aunts/Uncles: 1 paternal aunt and 1 paternal uncle with no hx of cancer Paternal cousins: no hx of cancer Paternal grandfather: deceased due to TB Paternal grandmother:deceased, think she declined in health after a fall  Ms. Capek's mother: died at 65 due to stroke.  She might have had colon cancer, the patient and her daughter were unsure, but thought maybe she had a history of this.  Maternal Aunts/Uncles: 1 maternal uncle died in a vehicle accident, 2 maternal aunts with no know hx of cancer, but limited info available about these relatives Maternal cousins: no known history of cancer, limited info Maternal grandfather: unk Maternal  grandmother:unk  Ms. Shock as a nice who had genetic testing that was negative.  Patient's maternal ancestors are of [place] descent, and paternal ancestors are of [place] descent. There is no reported Ashkenazi Jewish ancestry. There is no known consanguinity  GENETIC TEST RESULTS: Genetic testing performed through [facility]'s Common Hereditary Cancers Panel reported out on 07/25/2018 showed no pathogenic mutations. The Common Hereditary Cancer Panel offered by [facility] includes sequencing and/or deletion duplication testing of the following 55 genes: APC, ATM, AXIN2, BARD1, BLM, BMPR1A, BRCA1, BRCA2, BRIP1, BUB1B, CDH1, CDK4, CDKN2A, CEP57, CHEK2, CTNNA1, DICER1, ENG, EPCAM, GALNT12, GREM1, HOXB13, KIT, MEN1, MLH1, MLH3, MSH2, MSH3, MSH6, MUTYH, NBN, NF1, NTHL1, PALB2, PDGFRA, PMS2, POLD1, POLE, PTEN, RAD50, RAD51C, RAD51D, RNF43, RPS20, SDHA, SDHB, SDHC, SDHD, SMAD4, SMARCA4, STK11, TP53, TSC1, TSC2, VHL  A variant of uncertain significance (VUS) in a gene called ATM was also noted. c.8268+6T>A (Intronic) A variant of uncertain significance (VUS) in a gene called POLE was also noted. c.6767G>C (p.Gly2256Ala)  The test report will be scanned into EPIC and will be located under the Molecular Pathology section of the Results Review tab. A portion of the result report is included below for reference.     We discussed with Ms. Branan that because current genetic testing is not perfect, it is possible there may be a gene mutation in one of these genes that current testing cannot detect, but that chance is small.  We also discussed, that there could be another gene that has not yet been discovered, or that we have not yet tested, that is responsible for the cancer diagnoses in the family. It is also possible there is a hereditary cause for the cancer in the family that Ms. Folse did not inherit and therefore was not identified in her testing.  Therefore, it is important to remain in touch with cancer  genetics in the future so that we can continue to offer Ms. Polidori the most up to date genetic testing.    Regarding the VUS's in ATM and POLE: At this time, it is unknown if these variants are associated with increased cancer risk or if they are normal findings, but most variants such as these get reclassified to being inconsequential. They should not be used to make medical management decisions. With time, we suspect the lab will determine the significance of these variants, if any. If we do learn more about them, we will try to contact Ms. Heffernan to discuss it further. However, it is important to stay in touch with [place] periodically and keep the address and phone number up to date.  ADDITIONAL GENETIC TESTING: We discussed with Ms. Bevel that her genetic testing was fairly extensive.  If there are are genes identified to increase cancer risk that can be analyzed in the future, we would be happy to discuss and coordinate this testing at  that time.    CANCER SCREENING RECOMMENDATIONS: Ms. Lama test result is considered negative (normal).  This means that we have not identified a hereditary cause for her personal and family history of cancer at this time.   While reassuring, this does not definitively rule out a hereditary predisposition to cancer. It is still possible that there could be genetic mutations that are undetectable by current technology, or genetic mutations in genes that have not been tested or identified to increase cancer risk.  Therefore, it is recommended she continue to follow the cancer management and screening guidelines provided by her oncology and primary healthcare provider. An individual's cancer risk is not determined by genetic test results alone.  Overall cancer risk assessment includes additional factors such as personal medical history, family history, etc.  These should be used to make a personalized plan for cancer prevention and surveillance.    RECOMMENDATIONS FOR FAMILY  MEMBERS:  Relatives in this family might be at some increased risk of developing cancer, over the general population risk, simply due to the family history of cancer.  We recommended women in this family have a yearly mammogram beginning at age 30, or 26 years younger than the earliest onset of cancer, an annual clinical breast exam, and perform monthly breast self-exams. Women in this family should also have a gynecological exam as recommended by their primary provider. All family members should have a colonoscopy as directed by their doctors.  All family members should inform their physicians about the family history of cancer so their doctors can make the most appropriate screening recommendations for them.   FOLLOW-UP: Lastly, we discussed with Ms. Houpt that cancer genetics is a rapidly advancing field and it is possible that new genetic tests will be appropriate for her and/or her family members in the future. We encouraged her to remain in contact with cancer genetics on an annual basis so we can update her personal and family histories and let her know of advances in cancer genetics that may benefit this family.   Our contact number was provided. Ms. Herdt questions were answered to her satisfaction, and she knows she is welcome to call us at anytime with additional questions or concerns.   Ferol Luz, MS, [facility] Certified Genetic Counselor [email or web address]

## 2018-09-06 NOTE — Progress Notes (Signed)
[place]  [phone] [phone]     ID: [name] DOB: 1928/11/12  MR#: [id number]  ASN#:[id number]  Patient Care Team: Leighton Ruff, MD as PCP - General (Family Medicine) Jacolyn Reedy, MD as Consulting Physician (Cardiology) Carnelia Oscar, Virgie Dad, MD as Consulting Physician (Oncology) Excell Seltzer, MD as Consulting Physician (General Surgery) Landis Martins, DPM as Consulting Physician (Podiatry) Linus Mako, NP (Nurse Practitioner) OTHER MD:  CHIEF COMPLAINT: Estrogen receptor positive ductal carcinoma in situ  CURRENT TREATMENT: Observation   HISTORY OF CURRENT ILLNESS: From the original intake note:  [name] has a history of left breast cancer in 1989, s/p lumpectomy and left axillary lymph node dissection (with 16  lymph nodes removed, all clear) followed by 5 weeks of radiation. She did not receive chemotherapy or antiestrogens and does not recall meeting with a medical oncologist.  More recently, she had routine screening mammography on 04/11/2018 showing a possible abnormality in the left breast. She underwent unilateral left diagnostic mammography with tomography at [facility] on 04/15/2018 showing: breast density category B. There are grouped calcifications in the left breast measuring 2.4 x 1.1 x 0.7 cm. There is no associated mass.   Accordingly on 04/19/2018 she proceeded to biopsy of the left breast area in question. The pathology from this procedure showed ([id number]): Ductal carcinoma in situ, intermediate grade with calcifications. Prognostic indicators significant for: estrogen receptor, 100% positive with strong staining intensity and progesterone receptor, 0% negative.  She underwent left lumpectomy ([id number]) on 05/16/2018 with pathology showing: High grade ductal carcinoma in situ with calcifications measuring 0.9 cm. Resection margins are negative  The patient's subsequent  history is as detailed below.  INTERVAL HISTORY: [name] returns today for follow-up to her genetics testing and antiestrogens.  The patient took anastrozole for approximately 1 week, then developed persistent diarrhea.  She stopped the medication.  It took a few weeks for the diarrhea to resolve.  Whether or not it had anything to do with the anastrozole it certainly coincided with it and she is not amenable to going back on that pill.  Her last bone density screening was on 10/25/2015, with a T-score of -2.8, which is osteoporotic.  Since her last visit here, she had genetic testing on 07/19/2018 with results detailed below.  REVIEW OF SYSTEMS: [name] is doing well overall. She had diarrhea, but that has since been resolved; she has had normal bowel movements for the last three weeks. For Thanksgiving, she will be going to her son's house, and she will be making deviled eggs and cranberry salad. She is living at [facility]. She recently lost a good friend, [name]. For exercise, she walks every day, does chair-fit classes on Mondays, Wednesday, and Fridays, she attends balance classes on Tuesdays and Thursdays, and she has yoga classes on Friday. The patient denies unusual headaches, visual changes, nausea, vomiting, or dizziness. There has been no unusual cough, phlegm production, or pleurisy. This been no change bladder habits. The patient denies unexplained fatigue or unexplained weight loss, bleeding, rash, or fever. A detailed review of systems was otherwise noncontributory.    PAST MEDICAL HISTORY: Past Medical History:  Diagnosis Date  . Anemia   . Anginal pain ([place])   . Arthritis   . Breast cancer ([place])   . Chronic kidney disease   . Complete heart block (H[facility])    a. s/p MDT dual chamber pacemaker Dr Rayann Heman  . Family history of breast cancer   .  Family history of lung cancer   . Hyperlipidemia   . Hypertension   . Osteoporosis   . Peripheral vascular  disease ([place])   . Personal history of radiation therapy 1985   Left Breast Cancer  . Presence of permanent cardiac pacemaker   . Vertigo     PAST SURGICAL HISTORY: Past Surgical History:  Procedure Laterality Date  . ABDOMINAL HYSTERECTOMY    . APPENDECTOMY    . BREAST LUMPECTOMY Left 1985  . BREAST LUMPECTOMY WITH RADIOACTIVE SEED LOCALIZATION Left 05/16/2018   Procedure: BREAST LUMPECTOMY WITH RADIOACTIVE SEED LOCALIZATION;  Surgeon: Excell Seltzer, MD;  Location: [facility];  Service: General;  Laterality: Left;  . EXCISION MORTON'S NEUROMA Bilateral   . GANGLION CYST EXCISION Left   . JOINT REPLACEMENT Right 2010   knee  . PACEMAKER INSERTION  11/13/14   MDT dual chamber pacemaker implanted by Dr Rayann Heman for CHB  . PERMANENT PACEMAKER INSERTION N/A 11/13/2014   Procedure: PERMANENT PACEMAKER INSERTION;  Surgeon: Thompson Grayer, MD;  Location: [facility] CATH LAB;  Service: Cardiovascular;  Laterality: N/A;  . TOTAL HIP ARTHROPLASTY Bilateral   Bilateral cataract removal   FAMILY HISTORY Family History  Problem Relation Age of Onset  . Hypertension Mother        possibly per pt  . Hyperlipidemia Mother        possibly per pt  . Transient ischemic attack Mother        multiple  . CVA Mother   . Alcoholism Father   . Other Father 42       cerebral hemorrhage  . Cirrhosis Brother   . Alcoholism Brother   . Cancer Maternal Aunt        lung, hx smoking  . Breast cancer Sister 73  . Breast cancer Sister 38       recurred at 20  . Tuberculosis Paternal Grandfather   The patient's father was a heavy smoker and drinker and died at age 34 from cerebral hemorrhage. The patient's mother  died from a stroke and coma at age 26. The patient had 3 brothers and 2 sisters all now deceased. The patient's oldest sister was diagnosed with breast cancer at 24 and again at 57. The patient's younger sister was diagnosed with breast cancer at age 63 and died at age 71. The patient's older brother died of  lung cancer (he was a heavy smoker). The patient's younger brother died of liver cirrhosis (he was a heavy drinker and smoker).  A third brother died in his 81s.  There is no history of ovarian cancer in the family.   GYNECOLOGIC HISTORY:  Status post remote hysterectomy and unilateral salpingo-oophorectomy age 68 Menarche: 84 years old Age at first live birth: 84 years old She is GX P3.  She took estrogen HRT from age [age].     SOCIAL HISTORY:  Chermaine lives in [place] [facility]. She exercises everyday and loves swimming. She is divorced. The patient's daughter [name] is a NICU RN at [employer] patient's son, [name] lives in [place] and works for [employer] in [place]. The patient's son [name] is a Scientist, research (medical) for the [employer] in [place], [place]. The patient has 6 grandchildren, no great-grandchildren. She attends [employer].     ADVANCED DIRECTIVES: The patient's daughter Collie Siad is her healthcare power of attorney   HEALTH MAINTENANCE: Social History   Tobacco Use  . Smoking status: Never Smoker  . Smokeless tobacco:  Never Used  Substance Use Topics  . Alcohol use: Yes    Comment: rarely  . Drug use: No     Colonoscopy: Dr. Wallis Mart  PAP:  Bone density: on 10/24/2017 showed a T score of -2.8 osteoporosis   Allergies  Allergen Reactions  . Molds & Smuts Hives    Current Outpatient Medications  Medication Sig Dispense Refill  . anastrozole (ARIMIDEX) 1 MG tablet Take 1 tablet (1 mg total) by mouth daily. 90 tablet 4  . aspirin EC 81 MG tablet Take 81 mg by mouth daily.    Marland Kitchen atorvastatin (LIPITOR) 20 MG tablet Take 20 mg by mouth every evening.    . Calcium Carb-Cholecalciferol (CALCIUM 600 + D PO) Take 1 tablet by mouth 2 (two) times daily.     . clopidogrel (PLAVIX) 75 MG tablet Take 1 tablet (75 mg total) by mouth daily.    . Coenzyme Q10-Vitamin E (QUNOL ULTRA COQ10 PO) Take 1  capsule by mouth every evening.    . fexofenadine (ALLEGRA) 180 MG tablet Take 180 mg by mouth daily.    Marland Kitchen ketotifen (ZADITOR) 0.025 % ophthalmic solution Place 1 drop into both eyes 2 (two) times daily as needed (for itchy eyes.).    Marland Kitchen losartan (COZAAR) 50 MG tablet Take 50 mg by mouth every evening.     . magnesium oxide (MAG-OX) 400 MG tablet Take 400 mg by mouth daily.    . Melatonin 3 MG TABS Take 6 mg by mouth at bedtime.    . metoprolol succinate (TOPROL-XL) 25 MG 24 hr tablet Take 25 mg by mouth daily.    . Multiple Vitamin (MULTIVITAMIN WITH MINERALS) TABS tablet Take 1 tablet by mouth daily. Multivitamin for 50+    . nitroGLYCERIN (NITROSTAT) 0.4 MG SL tablet Place 0.4 mg under the tongue every 5 (five) minutes x 3 doses as needed. Chest pain    . traMADol (ULTRAM) 50 MG tablet Take 1 tablet (50 mg total) by mouth every 6 (six) hours as needed. 10 tablet 1   No current facility-administered medications for this visit.     OBJECTIVE: Older white woman in no acute distress  Vitals:   09/07/18 1420  BP: 128/77  Pulse: 86  Resp: 18  Temp: 97.7 F (36.5 C)  SpO2: 95%     Body mass index is 24.8 kg/m.   Wt Readings from Last 3 Encounters:  09/07/18 134 lb 8 oz (61 kg)  06/08/18 134 lb 8 oz (61 kg)  05/16/18 135 lb (61.2 kg)      ECOG FS:0 - Asymptomatic  Sclerae unicteric, EOMs intact Oropharynx clear and moist No cervical or supraclavicular adenopathy Lungs no rales or rhonchi Heart regular rate and rhythm Abd soft, nontender, positive bowel sounds MSK no focal spinal tenderness, no upper extremity lymphedema Neuro: nonfocal, well oriented, appropriate affect Breasts: Breast is benign.  The left breast has undergone lumpectomy and radiation.  There is no evidence of local recurrence.  The left breast is markedly smaller than the right.  Both axillae are benign.  LAB RESULTS:  CMP     Component Value Date/Time   NA 140 06/08/2018 1551   K 4.3 06/08/2018 1551    CL 101 06/08/2018 1551   CO2 30 06/08/2018 1551   GLUCOSE 109 (H) 06/08/2018 1551   BUN 24 (H) 06/08/2018 1551   CREATININE 1.55 (H) 06/08/2018 1551   CALCIUM 10.0 06/08/2018 1551   PROT 7.6 06/08/2018 1551   ALBUMIN 3.9 06/08/2018  1551   AST 24 06/08/2018 1551   ALT 14 06/08/2018 1551   ALKPHOS 103 06/08/2018 1551   BILITOT 0.4 06/08/2018 1551   GFRNONAA 29 (L) 06/08/2018 1551   GFRAA 34 (L) 06/08/2018 1551    No results found for: TOTALPROTELP, ALBUMINELP, A1GS, A2GS, BETS, BETA2SER, GAMS, MSPIKE, SPEI  No results found for: KPAFRELGTCHN, LAMBDASER, KAPLAMBRATIO  Lab Results  Component Value Date   WBC 6.8 06/08/2018   NEUTROABS 3.2 06/08/2018   HGB 11.8 06/08/2018   HCT 34.8 06/08/2018   MCV 97.0 06/08/2018   PLT 300 06/08/2018    _0 @  No results found for: LABCA2  No components found for: [id number]  No results for input(s): INR in the last 168 hours.  No results found for: LABCA2  No results found for: GQQ761  No results found for: PJK932  No results found for: IZT245  No results found for: CA2729  No components found for: HGQUANT  No results found for: CEA1 / No results found for: CEA1   No results found for: AFPTUMOR  No results found for: CHROMOGRNA  No results found for: PSA1  No visits with results within 3 Day(s) from this visit.  Latest known visit with results is:  Clinical Support on 07/15/2018  Component Date Value Ref Range Status  . Date Time Interrogation Session 07/15/2018 [id number]   Final  . Pulse Generator Manufacturer 07/15/2018 MERM   Final  . Pulse Gen Model 07/15/2018 ADDRL1 Adapta   Final  . Pulse Gen Serial Number 07/15/2018 [id number]   Final  . Clinic Name 07/15/2018 [facility]   Final  . Implantable Pulse Generator Type 07/15/2018 Implantable Pulse Generator   Final  . Implantable Pulse Generator Implan* 07/15/2018 [id number]   Final  . Implantable Lead Manufacturer 07/15/2018 MERM   Final  .  Implantable Lead Model 07/15/2018 5076 CapSureFix Novus   Final  . Implantable Lead Serial Number 07/15/2018 [id number]   Final  . Implantable Lead Implant Date 07/15/2018 [id number]   Final  . Implantable Lead Location Detail 1 07/15/2018 APPENDAGE   Final  . Implantable Lead Location 07/15/2018 [id number]   Final  . Implantable Lead Manufacturer 07/15/2018 MERM   Final  . Implantable Lead Model 07/15/2018 5092 CapSure SP Novus   Final  . Implantable Lead Serial Number 07/15/2018 [id number]   Final  . Implantable Lead Implant Date 07/15/2018 [id number]   Final  . Implantable Lead Location Detail 1 07/15/2018 APEX   Final  . Implantable Lead Location 07/15/2018 [id number]   Final  . Lead Channel Setting Sensing Sensi* 07/15/2018 4.00  mV Final  . Lead Channel Setting Pacing Amplit* 07/15/2018 2.000  V Final  . Lead Channel Setting Pacing Pulse * 07/15/2018 0.40  ms Final  . Lead Channel Setting Pacing Amplit* 07/15/2018 2.500  V Final  . Lead Channel Impedance Value 07/15/2018 401  ohm Final  . Lead Channel Pacing Threshold Ampl* 07/15/2018 0.500  V Final  . Lead Channel Pacing Threshold Puls* 07/15/2018 0.40  ms Final  . Lead Channel Impedance Value 07/15/2018 625  ohm Final  . Lead Channel Pacing Threshold Ampl* 07/15/2018 0.500  V Final  . Lead Channel Pacing Threshold Puls* 07/15/2018 0.40  ms Final  . Battery Status 07/15/2018 OK   Final  . Battery Remaining Longevity 07/15/2018 126  mo Final  . Battery Voltage 07/15/2018 2.79  V Final  . Battery Impedance 07/15/2018 161  ohm Final  . Brady Statistic AP VP Percent 07/15/2018 1  %  Final  Loletha Grayer Statistic AS VP Percent 07/15/2018 0  % Final  . Loletha Grayer Statistic AP VS Percent 07/15/2018 85  % Final  . Brady Statistic AS VS Percent 07/15/2018 13  % Final  . Eval Rhythm 07/15/2018 ApVs w/FFRW   Final    (this displays the last labs from the last 3 days)  No results found for: TOTALPROTELP, ALBUMINELP, A1GS, A2GS, BETS, BETA2SER, GAMS, MSPIKE,  SPEI (this displays SPEP labs)  No results found for: KPAFRELGTCHN, LAMBDASER, KAPLAMBRATIO (kappa/lambda light chains)  No results found for: HGBA, HGBA2QUANT, HGBFQUANT, HGBSQUAN (Hemoglobinopathy evaluation)   No results found for: LDH  No results found for: IRON, TIBC, IRONPCTSAT (Iron and TIBC)  No results found for: FERRITIN  Urinalysis    Component Value Date/Time   COLORURINE YELLOW 11/12/2014 [place] 11/12/2014 1516   LABSPEC 1.015 11/12/2014 1516   PHURINE 6.0 11/12/2014 [place] 11/12/2014 [place] 11/12/2014 1516   BILIRUBINUR NEGATIVE 11/12/2014 1516   KETONESUR NEGATIVE 11/12/2014 1516   PROTEINUR NEGATIVE 11/12/2014 1516   UROBILINOGEN 0.2 11/12/2014 1516   NITRITE NEGATIVE 11/12/2014 1516   LEUKOCYTESUR SMALL (A) 11/12/2014 1516     STUDIES: No results found.  ELIGIBLE FOR AVAILABLE RESEARCH PROTOCOL: no  ASSESSMENT: 84 y.o. [place], [place] woman   (1) status post left lumpectomy and full axillary lymph node dissection (16 lymph nodes removed, all clear) in 1989, followed by adjuvant radiation  (2) status post left breast upper outer quadrant biopsy for ductal carcinoma in situ, estrogen receptor strongly positive, progesterone receptor negative  (3) status post left lumpectomy 05/16/2018 for ductal carcinoma in situ, high-grade, 0.9 cm, with ample margins  (4) anastrozole started 06/08/2018  (a) Bone density: on 10/24/2017 showed a T score of -2.8 /osteoporosis  (5) genetics testing 07/25/2018 through the Common Hereditary Cancer Panel showed no deleterious mutations in APC, ATM, AXIN2, BARD1, BLM, BMPR1A, BRCA1, BRCA2, BRIP1, BUB1B, CDH1, CDK4, CDKN2A, CEP57, CHEK2, CTNNA1, DICER1, ENG, EPCAM, GALNT12, GREM1, HOXB13, KIT, MEN1, MLH1, MLH3, MSH2, MSH3, MSH6, MUTYH, NBN, NF1, NTHL1, PALB2, PDGFRA, PMS2, POLD1, POLE, PTEN, RAD50, RAD51C, RAD51D, RNF43, RPS20, SDHA, SDHB, SDHC, SDHD, SMAD4, SMARCA4, STK11, TP53,  TSC1, TSC2, VHL  (a)  2 Variants of uncertain significance in the genes ATM c.8268+6T>A (Intronic) and POLE c.6767G>C (p.Gly2256Ala) were identified.   PLAN: Tamela Oddi is now 3 months out from definitive surgery for her breast cancer.  The incisions have healed very nicely and she has no symptoms related to the surgery.  She gave anastrozole a fair try.  She did develop diarrhea which may or may not have been related to the medication.  At any rate we are certainly not going back to aromatase inhibitors.  Reinforcing this decision is the fact that she has osteoporosis and these agents can make the problem worse  We could consider tamoxifen but given the risk of clotting, which is potentially life threatening, in a patient with noninvasive breast cancer, at her age, I think observation is the best option  Accordingly she will follow-up with her surgeon in about 6 months and she will see me again in 1 year.  She is very pleased with this plan.  Today I did give her a copy of her genetics testing so she could shared with her family  She knows to call for any other issues that may develop before the next visit.   Ari Engelbrecht, Virgie Dad, MD  09/07/18 2:43 PM Medical Oncology and  Hematology [facility] [place], [place] [address] Tel. [phone]    Fax. [phone]   I, Jacqualyn Posey am acting as a Education administrator for Chauncey Cruel, MD.   I, Lurline Del MD, have reviewed the above documentation for accuracy and completeness, and I agree with the above.

## 2018-09-07 ENCOUNTER — Inpatient Hospital Stay: Payer: Medicare Other | Attending: Oncology | Admitting: Oncology

## 2018-09-07 VITALS — BP 128/77 | HR 86 | Temp 97.7°F | Resp 18 | Ht 61.75 in | Wt 134.5 lb

## 2018-09-07 DIAGNOSIS — M129 Arthropathy, unspecified: Secondary | ICD-10-CM | POA: Diagnosis not present

## 2018-09-07 DIAGNOSIS — Z79899 Other long term (current) drug therapy: Secondary | ICD-10-CM | POA: Diagnosis not present

## 2018-09-07 DIAGNOSIS — Z170 Estrogen receptor positive status [ER+]: Secondary | ICD-10-CM | POA: Diagnosis not present

## 2018-09-07 DIAGNOSIS — Z90722 Acquired absence of ovaries, bilateral: Secondary | ICD-10-CM | POA: Diagnosis not present

## 2018-09-07 DIAGNOSIS — Z803 Family history of malignant neoplasm of breast: Secondary | ICD-10-CM | POA: Diagnosis not present

## 2018-09-07 DIAGNOSIS — Z9221 Personal history of antineoplastic chemotherapy: Secondary | ICD-10-CM | POA: Diagnosis not present

## 2018-09-07 DIAGNOSIS — N183 Chronic kidney disease, stage 3 unspecified: Secondary | ICD-10-CM

## 2018-09-07 DIAGNOSIS — N189 Chronic kidney disease, unspecified: Secondary | ICD-10-CM | POA: Insufficient documentation

## 2018-09-07 DIAGNOSIS — C50412 Malignant neoplasm of upper-outer quadrant of left female breast: Secondary | ICD-10-CM

## 2018-09-07 DIAGNOSIS — E785 Hyperlipidemia, unspecified: Secondary | ICD-10-CM | POA: Diagnosis not present

## 2018-09-07 DIAGNOSIS — M810 Age-related osteoporosis without current pathological fracture: Secondary | ICD-10-CM

## 2018-09-07 DIAGNOSIS — D0512 Intraductal carcinoma in situ of left breast: Secondary | ICD-10-CM

## 2018-09-07 DIAGNOSIS — Z801 Family history of malignant neoplasm of trachea, bronchus and lung: Secondary | ICD-10-CM | POA: Diagnosis not present

## 2018-09-07 DIAGNOSIS — I129 Hypertensive chronic kidney disease with stage 1 through stage 4 chronic kidney disease, or unspecified chronic kidney disease: Secondary | ICD-10-CM

## 2018-09-07 DIAGNOSIS — M818 Other osteoporosis without current pathological fracture: Secondary | ICD-10-CM

## 2018-09-07 DIAGNOSIS — Z7982 Long term (current) use of aspirin: Secondary | ICD-10-CM

## 2018-09-07 DIAGNOSIS — Z90710 Acquired absence of both cervix and uterus: Secondary | ICD-10-CM

## 2018-10-14 DIAGNOSIS — M25511 Pain in right shoulder: Secondary | ICD-10-CM | POA: Diagnosis not present

## 2018-10-14 DIAGNOSIS — M19011 Primary osteoarthritis, right shoulder: Secondary | ICD-10-CM | POA: Diagnosis not present

## 2018-10-20 ENCOUNTER — Encounter: Payer: Self-pay | Admitting: Cardiology

## 2018-10-24 ENCOUNTER — Telehealth: Payer: Self-pay | Admitting: Internal Medicine

## 2018-10-24 NOTE — Telephone Encounter (Signed)
New Message:    Patient states that her machine just seem to not be working and patient wood like for someone to call.

## 2018-10-25 NOTE — Telephone Encounter (Signed)
New monitor shipped from [employer] on 10-24-2018.

## 2018-10-31 ENCOUNTER — Ambulatory Visit (INDEPENDENT_AMBULATORY_CARE_PROVIDER_SITE_OTHER): Payer: Medicare HMO

## 2018-10-31 DIAGNOSIS — E7800 Pure hypercholesterolemia, unspecified: Secondary | ICD-10-CM | POA: Diagnosis not present

## 2018-10-31 DIAGNOSIS — S8991XA Unspecified injury of right lower leg, initial encounter: Secondary | ICD-10-CM | POA: Diagnosis not present

## 2018-10-31 DIAGNOSIS — D649 Anemia, unspecified: Secondary | ICD-10-CM | POA: Diagnosis not present

## 2018-10-31 DIAGNOSIS — N183 Chronic kidney disease, stage 3 (moderate): Secondary | ICD-10-CM | POA: Diagnosis not present

## 2018-10-31 DIAGNOSIS — Z950 Presence of cardiac pacemaker: Secondary | ICD-10-CM | POA: Diagnosis not present

## 2018-10-31 DIAGNOSIS — I442 Atrioventricular block, complete: Secondary | ICD-10-CM

## 2018-10-31 DIAGNOSIS — E559 Vitamin D deficiency, unspecified: Secondary | ICD-10-CM | POA: Diagnosis not present

## 2018-10-31 DIAGNOSIS — E878 Other disorders of electrolyte and fluid balance, not elsewhere classified: Secondary | ICD-10-CM | POA: Diagnosis not present

## 2018-10-31 DIAGNOSIS — I10 Essential (primary) hypertension: Secondary | ICD-10-CM | POA: Diagnosis not present

## 2018-11-01 LAB — CUP PACEART REMOTE DEVICE CHECK
Battery Impedance: 185 Ohm
Battery Remaining Longevity: 120 mo
Battery Voltage: 2.8 V
Brady Statistic AP VS Percent: 86 %
Date Time Interrogation Session: 20200117220753
Implantable Lead Location: 753859
Implantable Lead Model: 5076
Implantable Pulse Generator Implant Date: 20160202
Lead Channel Pacing Threshold Pulse Width: 0.4 ms
Lead Channel Setting Pacing Amplitude: 2 V
Lead Channel Setting Pacing Amplitude: 2.5 V
Lead Channel Setting Pacing Pulse Width: 0.4 ms
MDC IDC LEAD IMPLANT DT: 20160202
MDC IDC LEAD IMPLANT DT: 20160202
MDC IDC LEAD LOCATION: 753860
MDC IDC MSMT LEADCHNL RA IMPEDANCE VALUE: 391 Ohm
MDC IDC MSMT LEADCHNL RA PACING THRESHOLD AMPLITUDE: 0.5 V
MDC IDC MSMT LEADCHNL RV IMPEDANCE VALUE: 638 Ohm
MDC IDC MSMT LEADCHNL RV PACING THRESHOLD AMPLITUDE: 0.5 V
MDC IDC MSMT LEADCHNL RV PACING THRESHOLD PULSEWIDTH: 0.4 ms
MDC IDC SET LEADCHNL RV SENSING SENSITIVITY: 4 mV
MDC IDC STAT BRADY AP VP PERCENT: 1 %
MDC IDC STAT BRADY AS VP PERCENT: 0 %
MDC IDC STAT BRADY AS VS PERCENT: 13 %

## 2018-11-01 NOTE — Progress Notes (Signed)
Remote pacemaker transmission.   

## 2018-11-09 ENCOUNTER — Encounter: Payer: Self-pay | Admitting: Cardiology

## 2018-11-09 ENCOUNTER — Ambulatory Visit: Payer: Medicare HMO | Admitting: Cardiology

## 2018-11-09 VITALS — BP 130/68 | HR 67 | Ht 62.0 in | Wt 133.0 lb

## 2018-11-09 DIAGNOSIS — E7800 Pure hypercholesterolemia, unspecified: Secondary | ICD-10-CM

## 2018-11-09 DIAGNOSIS — Z950 Presence of cardiac pacemaker: Secondary | ICD-10-CM | POA: Diagnosis not present

## 2018-11-09 DIAGNOSIS — I10 Essential (primary) hypertension: Secondary | ICD-10-CM

## 2018-11-09 DIAGNOSIS — I209 Angina pectoris, unspecified: Secondary | ICD-10-CM

## 2018-11-09 NOTE — Patient Instructions (Signed)
Medication Instructions:  Your physician recommends that you continue on your current medications as directed. Please refer to the Current Medication list given to you today.  If you need a refill on your cardiac medications before your next appointment, please call your pharmacy.   Lab work:  None ordered  If you have labs (blood work) drawn today and your tests are completely normal, you will receive your results only by: Marland Kitchen MyChart Message (if you have MyChart) OR . A paper copy in the mail If you have any lab test that is abnormal or we need to change your treatment, we will call you to review the results.  Testing/Procedures: None ordered  Follow-Up: At [facility], you and your health needs are our priority.  As part of our continuing mission to provide you with exceptional heart care, we have created designated Provider Care Teams.  These Care Teams include your primary Cardiologist (physician) and Advanced Practice Providers (APPs -  Physician Assistants and Nurse Practitioners) who all work together to provide you with the care you need, when you need it. You will need a follow up appointment in 6 months.  Please call our office 2 months in advance to schedule this appointment.  You may see  [facility] or another member of our [employer] Provider Team in [place]: Shirlee More, MD . Jyl Heinz, MD

## 2018-11-09 NOTE — Progress Notes (Signed)
Cardiology Office Note:    Date:  11/09/2018   ID:  [name], DOB 1929-05-08, MRN [id number]  PCP:  Leighton Ruff, MD  Cardiologist:  Jenne Campus, MD    Referring MD: Leighton Ruff, MD   Chief Complaint  Patient presents with  . Follow-up  Doing well  History of Present Illness:    [name] is a 85 y.o. female patient of Dr. Wynonia Lawman with past medical history significant for pacemaker, hypertension, dyslipidemia, chest pain overall doing well she lives in a retirement community for last 8 years and she is very happy she is very active she tried to walk she try to exercise she participate in social activities she is enjoying this tremendously.  Recently she fell down when she was trying to put some sweater on lost her balance and she injured her right ankle it is getting better but still slightly swollen denies having any chest pain tightness squeezing pressure burning in chest.  Past Medical History:  Diagnosis Date  . Anemia   . Anginal pain ([place])   . Arthritis   . Breast cancer ([place])   . Chronic kidney disease   . Complete heart block (HCC)    a. s/p MDT dual chamber pacemaker Dr Rayann Heman  . Family history of breast cancer   . Family history of lung cancer   . Hyperlipidemia   . Hypertension   . Osteoporosis   . Peripheral vascular disease ([place])   . Personal history of radiation therapy 1985   Left Breast Cancer  . Presence of permanent cardiac pacemaker   . Vertigo     Past Surgical History:  Procedure Laterality Date  . ABDOMINAL HYSTERECTOMY    . APPENDECTOMY    . BREAST LUMPECTOMY Left 1985  . BREAST LUMPECTOMY WITH RADIOACTIVE SEED LOCALIZATION Left 05/16/2018   Procedure: BREAST LUMPECTOMY WITH RADIOACTIVE SEED LOCALIZATION;  Surgeon: Excell Seltzer, MD;  Location: [facility];  Service: General;  Laterality: Left;  . EXCISION MORTON'S NEUROMA Bilateral   . GANGLION CYST EXCISION Left   . JOINT REPLACEMENT Right 2010   knee  . PACEMAKER  INSERTION  11/13/14   MDT dual chamber pacemaker implanted by Dr Rayann Heman for CHB  . PERMANENT PACEMAKER INSERTION N/A 11/13/2014   Procedure: PERMANENT PACEMAKER INSERTION;  Surgeon: Thompson Grayer, MD;  Location: [facility] CATH LAB;  Service: Cardiovascular;  Laterality: N/A;  . TOTAL HIP ARTHROPLASTY Bilateral     Current Medications: Current Meds  Medication Sig  . anastrozole (ARIMIDEX) 1 MG tablet Take 1 tablet (1 mg total) by mouth daily.  Marland Kitchen aspirin EC 81 MG tablet Take 81 mg by mouth daily.  Marland Kitchen atorvastatin (LIPITOR) 20 MG tablet Take 20 mg by mouth every evening.  . Calcium Carb-Cholecalciferol (CALCIUM 600 + D PO) Take 1 tablet by mouth 2 (two) times daily.   . clopidogrel (PLAVIX) 75 MG tablet Take 1 tablet (75 mg total) by mouth daily.  . Coenzyme Q10-Vitamin E (QUNOL ULTRA COQ10 PO) Take 1 capsule by mouth every evening.  . fexofenadine (ALLEGRA) 180 MG tablet Take 180 mg by mouth daily.  Marland Kitchen ketotifen (ZADITOR) 0.025 % ophthalmic solution Place 1 drop into both eyes 2 (two) times daily as needed (for itchy eyes.).  Marland Kitchen losartan (COZAAR) 50 MG tablet Take 50 mg by mouth every evening.   . magnesium oxide (MAG-OX) 400 MG tablet Take 400 mg by mouth daily.  . Melatonin 3 MG TABS Take 6 mg by mouth at bedtime.  Marland Kitchen  metoprolol succinate (TOPROL-XL) 25 MG 24 hr tablet Take 25 mg by mouth daily.  . Multiple Vitamin (MULTIVITAMIN WITH MINERALS) TABS tablet Take 1 tablet by mouth daily. Multivitamin for 50+  . nitroGLYCERIN (NITROSTAT) 0.4 MG SL tablet Place 0.4 mg under the tongue every 5 (five) minutes x 3 doses as needed. Chest pain  . traMADol (ULTRAM) 50 MG tablet Take 1 tablet (50 mg total) by mouth every 6 (six) hours as needed.  . TURMERIC PO Take 1 capsule by mouth daily.     Allergies:   Molds & smuts   Social History   Socioeconomic History  . Marital status: Divorced    Spouse name: Not on file  . Number of children: Not on file  . Years of education: Not on file  . Highest education  level: Not on file  Occupational History  . Not on file  Social Needs  . Financial resource strain: Not on file  . Food insecurity:    Worry: Not on file    Inability: Not on file  . Transportation needs:    Medical: Not on file    Non-medical: Not on file  Tobacco Use  . Smoking status: Never Smoker  . Smokeless tobacco: Never Used  Substance and Sexual Activity  . Alcohol use: Yes    Comment: rarely  . Drug use: No  . Sexual activity: Not on file  Lifestyle  . Physical activity:    Days per week: Not on file    Minutes per session: Not on file  . Stress: Not on file  Relationships  . Social connections:    Talks on phone: Not on file    Gets together: Not on file    Attends religious service: Not on file    Active member of club or organization: Not on file    Attends meetings of clubs or organizations: Not on file    Relationship status: Not on file  Other Topics Concern  . Not on file  Social History Narrative  . Not on file     Family History: The patient's family history includes Alcoholism in her brother and father; Breast cancer (age of onset: 69) in her sister; Breast cancer (age of onset: 59) in her sister; CVA in her mother; Cancer in her maternal aunt; Cirrhosis in her brother; Hyperlipidemia in her mother; Hypertension in her mother; Other (age of onset: 73) in her father; Transient ischemic attack in her mother; Tuberculosis in her paternal grandfather. ROS:   Please see the history of present illness.    All 14 point review of systems negative except as described per history of present illness  EKGs/Labs/Other Studies Reviewed:      Recent Labs: 06/08/2018: ALT 14; BUN 24; Creatinine 1.55; Hemoglobin 11.8; Platelet Count 300; Potassium 4.3; Sodium 140  Recent Lipid Panel No results found for: CHOL, TRIG, HDL, CHOLHDL, VLDL, LDLCALC, LDLDIRECT  Physical Exam:    VS:  BP 130/68   Pulse 67   Ht 5\' 2"  (1.575 m)   Wt 133 lb (60.3 kg)   SpO2 98%    BMI 24.33 kg/m     Wt Readings from Last 3 Encounters:  11/09/18 133 lb (60.3 kg)  09/07/18 134 lb 8 oz (61 kg)  06/08/18 134 lb 8 oz (61 kg)     GEN:  Well nourished, well developed in no acute distress HEENT: Normal NECK: No JVD; No carotid bruits LYMPHATICS: No lymphadenopathy CARDIAC: RRR, no murmurs, no rubs, no  gallops RESPIRATORY:  Clear to auscultation without rales, wheezing or rhonchi  ABDOMEN: Soft, non-tender, non-distended MUSCULOSKELETAL:  No edema; No deformity  SKIN: Warm and dry LOWER EXTREMITIES: no swelling NEUROLOGIC:  Alert and oriented x 3 PSYCHIATRIC:  Normal affect   ASSESSMENT:    1. Angina pectoris ([place])   2. Essential hypertension   3. Cardiac pacemaker in situ   4. Hypercholesterolemia    PLAN:    In order of problems listed above:  1. Angina pectoris denies having any.  We will continue conservative approach. 2. Essential hypertension blood pressure well controlled we will continue present management. 3. Pacemaker in situ: Recently interrogated 10 years left in the device all parameters are normal 4. Dyslipidemia fasting lipid profile acceptable we will continue present management.   Medication Adjustments/Labs and Tests Ordered: Current medicines are reviewed at length with the patient today.  Concerns regarding medicines are outlined above.  No orders of the defined types were placed in this encounter.  Medication changes: No orders of the defined types were placed in this encounter.   Signed, Park Liter, MD, [facility] 11/09/2018 10:54 AM    [place]

## 2018-12-19 ENCOUNTER — Ambulatory Visit (INDEPENDENT_AMBULATORY_CARE_PROVIDER_SITE_OTHER): Payer: Medicare HMO | Admitting: Internal Medicine

## 2018-12-19 ENCOUNTER — Encounter: Payer: Self-pay | Admitting: Internal Medicine

## 2018-12-19 VITALS — BP 126/78 | HR 85 | Ht 61.0 in | Wt 133.8 lb

## 2018-12-19 DIAGNOSIS — I10 Essential (primary) hypertension: Secondary | ICD-10-CM

## 2018-12-19 DIAGNOSIS — I442 Atrioventricular block, complete: Secondary | ICD-10-CM | POA: Diagnosis not present

## 2018-12-19 DIAGNOSIS — Z950 Presence of cardiac pacemaker: Secondary | ICD-10-CM

## 2018-12-19 NOTE — Patient Instructions (Signed)
Medication Instructions:  Your physician recommends that you continue on your current medications as directed. Please refer to the Current Medication list given to you today.  Labwork: None ordered.  Testing/Procedures: None ordered.  Follow-Up: Your physician recommends that you schedule a follow-up appointment in:   One Year with Tommye Standard, PA  Any Other Special Instructions Will Be Listed Below (If Applicable).     If you need a refill on your cardiac medications before your next appointment, please call your pharmacy.

## 2018-12-19 NOTE — Progress Notes (Signed)
PCP: Leighton Ruff, MD Primary Cardiologist: Dr Wynonia Lawman Primary EP:  Dr Rayann Heman  [name] is a 85 y.o. female who presents today for routine electrophysiology followup.  Since last being seen in our clinic, the patient reports doing very well.  Today, she denies symptoms of palpitations, chest pain, shortness of breath,  lower extremity edema, dizziness, presyncope, or syncope.  The patient is otherwise without complaint today.   Past Medical History:  Diagnosis Date  . Anemia   . Angina pectoris ([place]) 07/22/2018   Clinically diagnosed June 2019 and treted medically, abnormal EKG at that time.   . Anginal pain ([place])   . Arthritis   . Breast cancer ([place])   . Cardiac pacemaker in situ    Inserted 11/13/14 symptomatic complete heart block  Medtronic Adapta L model ADDRL 1 (serial number [id number])   RA lead  Medtronic model [id number] (serial number [id number])   RV lead  Medtronic model 5092- 18 (serial number [id number])     . Chronic kidney disease   . Complete heart block (HCC)    a. s/p MDT dual chamber pacemaker Dr Rayann Heman  . Dizziness and giddiness   . Family history of breast cancer   . Family history of lung cancer   . Genetic testing 08/01/2018   The Common Hereditary Cancer Panel offered by [facility] includes sequencing and/or deletion duplication testing of the following 55 genes: APC, ATM, AXIN2, BARD1, BLM, BMPR1A, BRCA1, BRCA2, BRIP1, BUB1B, CDH1, CDK4, CDKN2A, CEP57, CHEK2, CTNNA1, DICER1, ENG, EPCAM, GALNT12, GREM1, HOXB13, KIT, MEN1, MLH1, MLH3, MSH2, MSH3, MSH6, MUTYH, NBN, NF1, NTHL1, PALB2, PDGFRA, PMS2, POLD1, POLE, PTEN, RAD50,   . Hypercholesterolemia 11/12/2014  . Hyperlipidemia   . Hypertension   . Kidney disease, chronic, stage III (GFR 30-59 ml/min) (HCC)   . Malignant neoplasm of upper-outer quadrant of left breast in female, estrogen receptor positive ([place]) 05/25/2018  . Osteoporosis   . Peripheral vascular disease ([place])   . Personal history of breast  cancer 07/19/2018  . Personal history of radiation therapy 1985   Left Breast Cancer  . Presence of permanent cardiac pacemaker   . Vertigo    Past Surgical History:  Procedure Laterality Date  . ABDOMINAL HYSTERECTOMY    . APPENDECTOMY    . BREAST LUMPECTOMY Left 1985  . BREAST LUMPECTOMY WITH RADIOACTIVE SEED LOCALIZATION Left 05/16/2018   Procedure: BREAST LUMPECTOMY WITH RADIOACTIVE SEED LOCALIZATION;  Surgeon: Excell Seltzer, MD;  Location: [facility];  Service: General;  Laterality: Left;  . EXCISION MORTON'S NEUROMA Bilateral   . GANGLION CYST EXCISION Left   . JOINT REPLACEMENT Right 2010   knee  . PACEMAKER INSERTION  11/13/14   MDT dual chamber pacemaker implanted by Dr Rayann Heman for CHB  . PERMANENT PACEMAKER INSERTION N/A 11/13/2014   Medtronic Adapta L dual-chamber pacemaker by Dr Rayann Heman for symptomatic transient complete heart block  . TOTAL HIP ARTHROPLASTY Bilateral     ROS- all systems are reviewed and negative except as per HPI above  Current Outpatient Medications  Medication Sig Dispense Refill  . anastrozole (ARIMIDEX) 1 MG tablet Take 1 tablet (1 mg total) by mouth daily. 90 tablet 4  . aspirin EC 81 MG tablet Take 81 mg by mouth daily.    Marland Kitchen atorvastatin (LIPITOR) 20 MG tablet Take 20 mg by mouth every evening.    . Calcium Carb-Cholecalciferol (CALCIUM 600 + D PO) Take 1 tablet by mouth 2 (two) times daily.     Marland Kitchen  Coenzyme Q10-Vitamin E (QUNOL ULTRA COQ10 PO) Take 1 capsule by mouth every evening.    . fexofenadine (ALLEGRA) 180 MG tablet Take 180 mg by mouth daily.    Marland Kitchen ketotifen (ZADITOR) 0.025 % ophthalmic solution Place 1 drop into both eyes 2 (two) times daily as needed (for itchy eyes.).    Marland Kitchen losartan (COZAAR) 50 MG tablet Take 50 mg by mouth every evening.     . magnesium oxide (MAG-OX) 400 MG tablet Take 400 mg by mouth daily.    . Melatonin 3 MG TABS Take 6 mg by mouth at bedtime.    . metoprolol succinate (TOPROL-XL) 25 MG 24 hr tablet Take 25 mg by mouth  daily.    . Multiple Vitamin (MULTIVITAMIN WITH MINERALS) TABS tablet Take 1 tablet by mouth daily. Multivitamin for 50+    . nitroGLYCERIN (NITROSTAT) 0.4 MG SL tablet Place 0.4 mg under the tongue every 5 (five) minutes x 3 doses as needed. Chest pain    . traMADol (ULTRAM) 50 MG tablet Take 1 tablet (50 mg total) by mouth every 6 (six) hours as needed. 10 tablet 1  . TURMERIC PO Take 1 capsule by mouth daily.     No current facility-administered medications for this visit.     Physical Exam: Vitals:   12/19/18 1211  BP: 126/78  Pulse: 85  SpO2: 95%  Weight: 133 lb 12.8 oz (60.7 kg)  Height: _0  (1.549 m)    GEN- The patient is well appearing, alert and oriented x 3 today.   Head- normocephalic, atraumatic Eyes-  Sclera clear, conjunctiva pink Ears- hearing intact Oropharynx- clear Lungs- Clear to ausculation bilaterally, normal work of breathing Chest- pacemaker pocket is well healed Heart- Regular rate and rhythm, no murmurs, rubs or gallops, PMI not laterally displaced GI- soft, NT, ND, + BS Extremities- no clubbing, cyanosis, or edema  Pacemaker interrogation- reviewed in detail today,  See PACEART report  ekg tracing ordered today is personally reviewed and shows atrial paced rhythm  Assessment and Plan:  1. Symptomatic transient complete heart block Normal pacemaker function See Pace Art report No changes today She is AV conducting on her own today (not dependant)  2. HTN Stable No change required today  [facility] Return in a year to see EP PA  Thompson Grayer MD, [facility] 12/19/2018 12:16 PM

## 2018-12-20 ENCOUNTER — Other Ambulatory Visit: Payer: Self-pay

## 2018-12-20 DIAGNOSIS — I209 Angina pectoris, unspecified: Secondary | ICD-10-CM | POA: Diagnosis not present

## 2018-12-20 DIAGNOSIS — I10 Essential (primary) hypertension: Secondary | ICD-10-CM | POA: Diagnosis not present

## 2018-12-20 DIAGNOSIS — J309 Allergic rhinitis, unspecified: Secondary | ICD-10-CM | POA: Diagnosis not present

## 2018-12-20 DIAGNOSIS — K08409 Partial loss of teeth, unspecified cause, unspecified class: Secondary | ICD-10-CM | POA: Diagnosis not present

## 2018-12-20 DIAGNOSIS — M810 Age-related osteoporosis without current pathological fracture: Secondary | ICD-10-CM | POA: Diagnosis not present

## 2018-12-20 DIAGNOSIS — R252 Cramp and spasm: Secondary | ICD-10-CM | POA: Diagnosis not present

## 2018-12-20 DIAGNOSIS — E785 Hyperlipidemia, unspecified: Secondary | ICD-10-CM | POA: Diagnosis not present

## 2018-12-20 DIAGNOSIS — M1990 Unspecified osteoarthritis, unspecified site: Secondary | ICD-10-CM | POA: Diagnosis not present

## 2018-12-20 DIAGNOSIS — G4700 Insomnia, unspecified: Secondary | ICD-10-CM | POA: Diagnosis not present

## 2018-12-20 DIAGNOSIS — G8929 Other chronic pain: Secondary | ICD-10-CM | POA: Diagnosis not present

## 2018-12-20 LAB — CUP PACEART INCLINIC DEVICE CHECK
Battery Remaining Longevity: 121 mo
Battery Voltage: 2.79 V
Brady Statistic AP VP Percent: 1 %
Brady Statistic AP VS Percent: 86 %
Date Time Interrogation Session: 20200309155751
Implantable Lead Location: 753859
Implantable Lead Location: 753860
Implantable Lead Model: 5076
Implantable Lead Model: 5092
Implantable Pulse Generator Implant Date: 20160202
Lead Channel Impedance Value: 396 Ohm
Lead Channel Impedance Value: 637 Ohm
Lead Channel Pacing Threshold Amplitude: 0.5 V
Lead Channel Pacing Threshold Amplitude: 0.5 V
Lead Channel Pacing Threshold Pulse Width: 0.4 ms
Lead Channel Setting Sensing Sensitivity: 4 mV
MDC IDC LEAD IMPLANT DT: 20160202
MDC IDC LEAD IMPLANT DT: 20160202
MDC IDC MSMT BATTERY IMPEDANCE: 185 Ohm
MDC IDC MSMT LEADCHNL RA PACING THRESHOLD AMPLITUDE: 0.5 V
MDC IDC MSMT LEADCHNL RA PACING THRESHOLD PULSEWIDTH: 0.4 ms
MDC IDC MSMT LEADCHNL RA SENSING INTR AMPL: 0.5 mV
MDC IDC MSMT LEADCHNL RV PACING THRESHOLD AMPLITUDE: 0.5 V
MDC IDC MSMT LEADCHNL RV PACING THRESHOLD PULSEWIDTH: 0.4 ms
MDC IDC MSMT LEADCHNL RV PACING THRESHOLD PULSEWIDTH: 0.4 ms
MDC IDC MSMT LEADCHNL RV SENSING INTR AMPL: 11.2 mV
MDC IDC SET LEADCHNL RA PACING AMPLITUDE: 2 V
MDC IDC SET LEADCHNL RV PACING AMPLITUDE: 2.5 V
MDC IDC SET LEADCHNL RV PACING PULSEWIDTH: 0.4 ms
MDC IDC STAT BRADY AS VP PERCENT: 0 %
MDC IDC STAT BRADY AS VS PERCENT: 13 %

## 2018-12-21 NOTE — Addendum Note (Signed)
Addended by: Rose Phi on: 12/21/2018 02:17 PM   Modules accepted: Orders

## 2018-12-30 DIAGNOSIS — T148XXA Other injury of unspecified body region, initial encounter: Secondary | ICD-10-CM | POA: Diagnosis not present

## 2019-01-05 DIAGNOSIS — M545 Low back pain: Secondary | ICD-10-CM | POA: Diagnosis not present

## 2019-01-30 ENCOUNTER — Other Ambulatory Visit: Payer: Self-pay

## 2019-01-30 ENCOUNTER — Encounter: Payer: Medicare HMO | Admitting: *Deleted

## 2019-01-31 ENCOUNTER — Telehealth: Payer: Self-pay

## 2019-01-31 NOTE — Telephone Encounter (Signed)
Unable to speak  with patient to remind of missed remote transmission 

## 2019-02-08 ENCOUNTER — Encounter: Payer: Self-pay | Admitting: Cardiology

## 2019-02-20 ENCOUNTER — Other Ambulatory Visit: Payer: Self-pay

## 2019-02-20 ENCOUNTER — Ambulatory Visit (INDEPENDENT_AMBULATORY_CARE_PROVIDER_SITE_OTHER): Payer: Medicare HMO | Admitting: *Deleted

## 2019-02-20 DIAGNOSIS — I442 Atrioventricular block, complete: Secondary | ICD-10-CM

## 2019-02-20 DIAGNOSIS — Z950 Presence of cardiac pacemaker: Secondary | ICD-10-CM

## 2019-02-21 LAB — CUP PACEART REMOTE DEVICE CHECK
Battery Impedance: 210 Ohm
Battery Remaining Longevity: 118 mo
Battery Voltage: 2.8 V
Brady Statistic AP VP Percent: 1 %
Brady Statistic AP VS Percent: 75 %
Brady Statistic AS VP Percent: 0 %
Brady Statistic AS VS Percent: 24 %
Date Time Interrogation Session: 20200508214152
Implantable Lead Implant Date: 20160202
Implantable Lead Implant Date: 20160202
Implantable Lead Location: 753859
Implantable Lead Location: 753860
Implantable Lead Model: 5076
Implantable Lead Model: 5092
Implantable Pulse Generator Implant Date: 20160202
Lead Channel Impedance Value: 391 Ohm
Lead Channel Impedance Value: 623 Ohm
Lead Channel Pacing Threshold Amplitude: 0.5 V
Lead Channel Pacing Threshold Amplitude: 0.5 V
Lead Channel Pacing Threshold Pulse Width: 0.4 ms
Lead Channel Pacing Threshold Pulse Width: 0.4 ms
Lead Channel Setting Pacing Amplitude: 2 V
Lead Channel Setting Pacing Amplitude: 2.5 V
Lead Channel Setting Pacing Pulse Width: 0.4 ms
Lead Channel Setting Sensing Sensitivity: 4 mV

## 2019-03-02 NOTE — Progress Notes (Signed)
Remote pacemaker transmission.   

## 2019-03-22 DIAGNOSIS — M7062 Trochanteric bursitis, left hip: Secondary | ICD-10-CM | POA: Diagnosis not present

## 2019-03-22 DIAGNOSIS — M25562 Pain in left knee: Secondary | ICD-10-CM | POA: Diagnosis not present

## 2019-04-10 DIAGNOSIS — Z961 Presence of intraocular lens: Secondary | ICD-10-CM | POA: Diagnosis not present

## 2019-04-10 DIAGNOSIS — H52203 Unspecified astigmatism, bilateral: Secondary | ICD-10-CM | POA: Diagnosis not present

## 2019-04-10 DIAGNOSIS — H353111 Nonexudative age-related macular degeneration, right eye, early dry stage: Secondary | ICD-10-CM | POA: Diagnosis not present

## 2019-04-10 DIAGNOSIS — H02054 Trichiasis without entropian left upper eyelid: Secondary | ICD-10-CM | POA: Diagnosis not present

## 2019-04-21 DIAGNOSIS — K589 Irritable bowel syndrome without diarrhea: Secondary | ICD-10-CM | POA: Diagnosis not present

## 2019-04-21 DIAGNOSIS — R198 Other specified symptoms and signs involving the digestive system and abdomen: Secondary | ICD-10-CM | POA: Diagnosis not present

## 2019-05-19 DIAGNOSIS — Z86010 Personal history of colonic polyps: Secondary | ICD-10-CM | POA: Diagnosis not present

## 2019-05-19 DIAGNOSIS — R198 Other specified symptoms and signs involving the digestive system and abdomen: Secondary | ICD-10-CM | POA: Diagnosis not present

## 2019-05-22 ENCOUNTER — Ambulatory Visit (INDEPENDENT_AMBULATORY_CARE_PROVIDER_SITE_OTHER): Payer: Medicare HMO | Admitting: *Deleted

## 2019-05-22 DIAGNOSIS — I442 Atrioventricular block, complete: Secondary | ICD-10-CM

## 2019-05-22 LAB — CUP PACEART REMOTE DEVICE CHECK
Battery Impedance: 234 Ohm
Battery Remaining Longevity: 113 mo
Battery Voltage: 2.79 V
Brady Statistic AP VP Percent: 1 %
Brady Statistic AP VS Percent: 85 %
Brady Statistic AS VP Percent: 0 %
Brady Statistic AS VS Percent: 15 %
Date Time Interrogation Session: 20200810215145
Implantable Lead Implant Date: 20160202
Implantable Lead Implant Date: 20160202
Implantable Lead Location: 753859
Implantable Lead Location: 753860
Implantable Lead Model: 5076
Implantable Lead Model: 5092
Implantable Pulse Generator Implant Date: 20160202
Lead Channel Impedance Value: 386 Ohm
Lead Channel Impedance Value: 651 Ohm
Lead Channel Pacing Threshold Amplitude: 0.5 V
Lead Channel Pacing Threshold Amplitude: 0.5 V
Lead Channel Pacing Threshold Pulse Width: 0.4 ms
Lead Channel Pacing Threshold Pulse Width: 0.4 ms
Lead Channel Setting Pacing Amplitude: 2 V
Lead Channel Setting Pacing Amplitude: 2.5 V
Lead Channel Setting Pacing Pulse Width: 0.4 ms
Lead Channel Setting Sensing Sensitivity: 4 mV

## 2019-05-30 ENCOUNTER — Encounter: Payer: Self-pay | Admitting: Cardiology

## 2019-05-30 NOTE — Progress Notes (Signed)
Remote pacemaker transmission.   

## 2019-06-26 ENCOUNTER — Other Ambulatory Visit: Payer: Self-pay | Admitting: Physician Assistant

## 2019-06-26 DIAGNOSIS — R198 Other specified symptoms and signs involving the digestive system and abdomen: Secondary | ICD-10-CM

## 2019-06-26 DIAGNOSIS — Z86010 Personal history of colonic polyps: Secondary | ICD-10-CM

## 2019-07-03 ENCOUNTER — Ambulatory Visit
Admission: RE | Admit: 2019-07-03 | Discharge: 2019-07-03 | Disposition: A | Discharge status: Home / Self Care | Payer: Medicare HMO | Source: Ambulatory Visit | Attending: Physician Assistant | Admitting: Physician Assistant

## 2019-07-03 DIAGNOSIS — R198 Other specified symptoms and signs involving the digestive system and abdomen: Secondary | ICD-10-CM

## 2019-07-03 DIAGNOSIS — K5730 Diverticulosis of large intestine without perforation or abscess without bleeding: Secondary | ICD-10-CM | POA: Diagnosis not present

## 2019-07-03 DIAGNOSIS — Z86010 Personal history of colonic polyps: Secondary | ICD-10-CM

## 2019-07-25 DIAGNOSIS — Z23 Encounter for immunization: Secondary | ICD-10-CM | POA: Diagnosis not present

## 2019-08-11 ENCOUNTER — Telehealth: Payer: Self-pay | Admitting: Oncology

## 2019-08-11 NOTE — Telephone Encounter (Signed)
Returned patient's phone call regarding cancelling an appointment, per 10/30 schedule message appointment is cancelled due to patient's request. Patient will call when ready to reschedule.

## 2019-08-15 ENCOUNTER — Inpatient Hospital Stay: Payer: Medicare HMO | Admitting: Oncology

## 2019-08-21 ENCOUNTER — Ambulatory Visit (INDEPENDENT_AMBULATORY_CARE_PROVIDER_SITE_OTHER): Payer: Medicare HMO | Admitting: *Deleted

## 2019-08-21 DIAGNOSIS — I442 Atrioventricular block, complete: Secondary | ICD-10-CM | POA: Diagnosis not present

## 2019-08-22 LAB — CUP PACEART REMOTE DEVICE CHECK
Battery Impedance: 234 Ohm
Battery Remaining Longevity: 114 mo
Battery Voltage: 2.79 V
Brady Statistic AP VP Percent: 1 %
Brady Statistic AP VS Percent: 86 %
Brady Statistic AS VP Percent: 0 %
Brady Statistic AS VS Percent: 14 %
Date Time Interrogation Session: 20201109153305
Implantable Lead Implant Date: 20160202
Implantable Lead Implant Date: 20160202
Implantable Lead Location: 753859
Implantable Lead Location: 753860
Implantable Lead Model: 5076
Implantable Lead Model: 5092
Implantable Pulse Generator Implant Date: 20160202
Lead Channel Impedance Value: 396 Ohm
Lead Channel Impedance Value: 636 Ohm
Lead Channel Pacing Threshold Amplitude: 0.5 V
Lead Channel Pacing Threshold Amplitude: 0.5 V
Lead Channel Pacing Threshold Pulse Width: 0.4 ms
Lead Channel Pacing Threshold Pulse Width: 0.4 ms
Lead Channel Setting Pacing Amplitude: 2 V
Lead Channel Setting Pacing Amplitude: 2.5 V
Lead Channel Setting Pacing Pulse Width: 0.4 ms
Lead Channel Setting Sensing Sensitivity: 4 mV

## 2019-09-19 NOTE — Progress Notes (Signed)
Remote pacemaker transmission.   

## 2019-10-02 IMAGING — MG DIGITAL SCREENING BILATERAL MAMMOGRAM WITH TOMO AND CAD
8 series · 8 of 24 positions shown · non-contrast
Comparison: Previous exam(s).

ACR Breast Density Category a: The breast tissue is almost entirely
fatty.

CLINICAL DATA: Screening.

EXAM:
DIGITAL SCREENING BILATERAL MAMMOGRAM WITH TOMO AND CAD

[L MLO synth-2D]
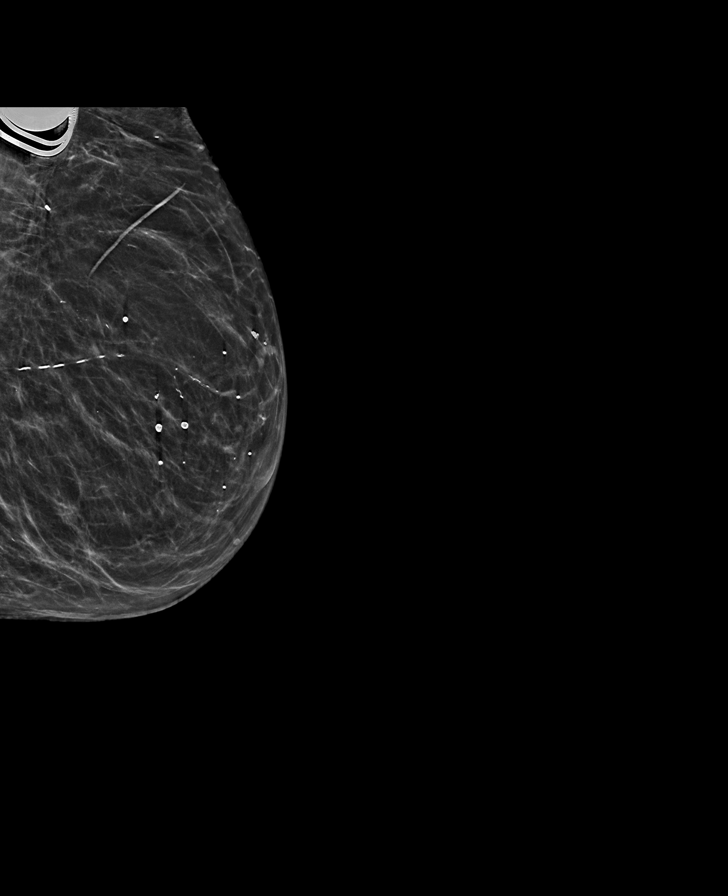

[R CC synth-2D]
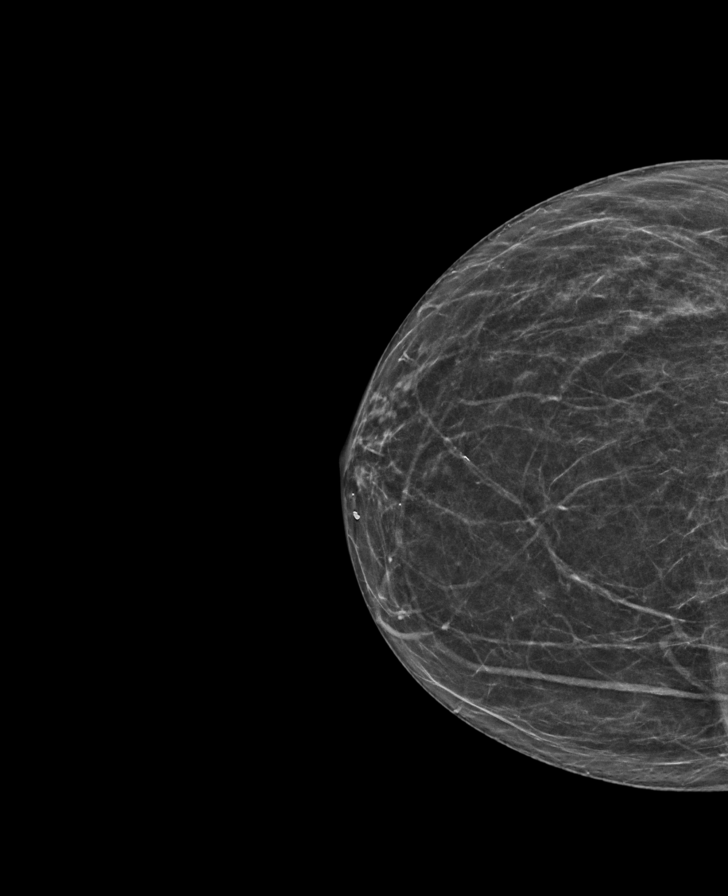

[L CC synth-2D]
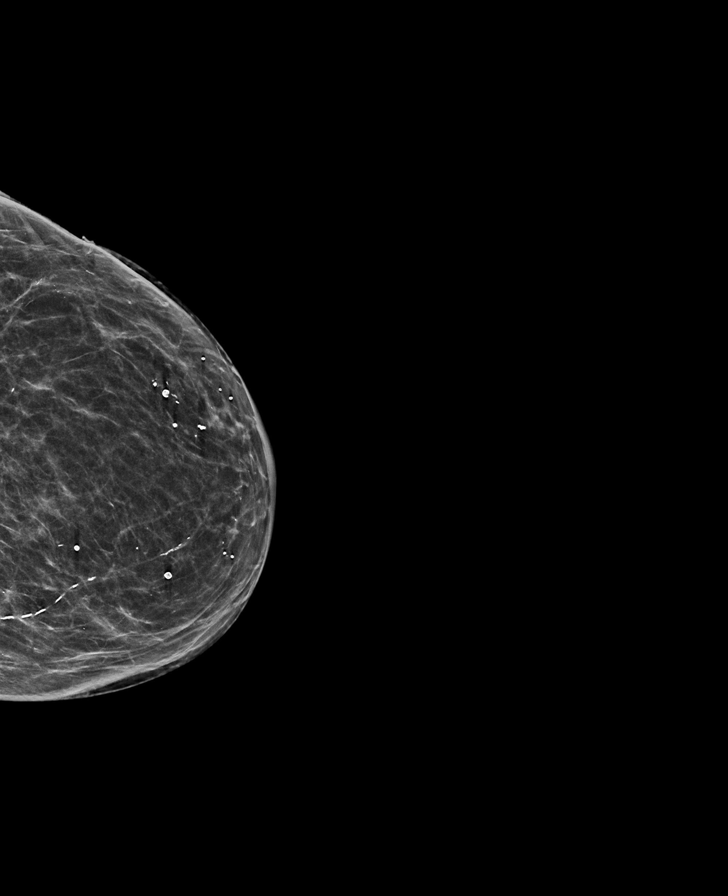

[R MLO synth-2D]
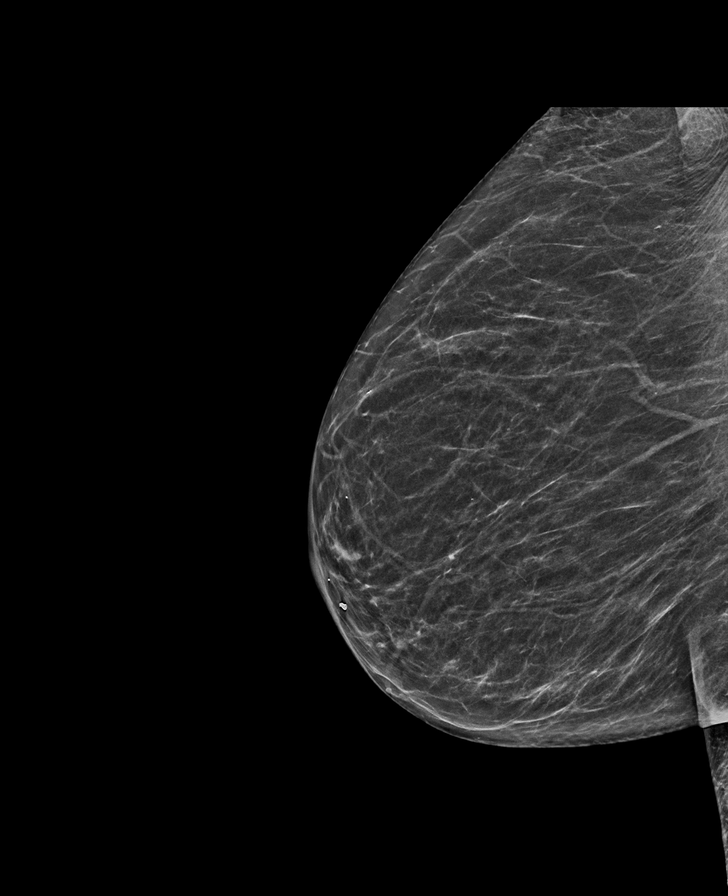

[L MLO tomo · tomo slice 29/57.0]
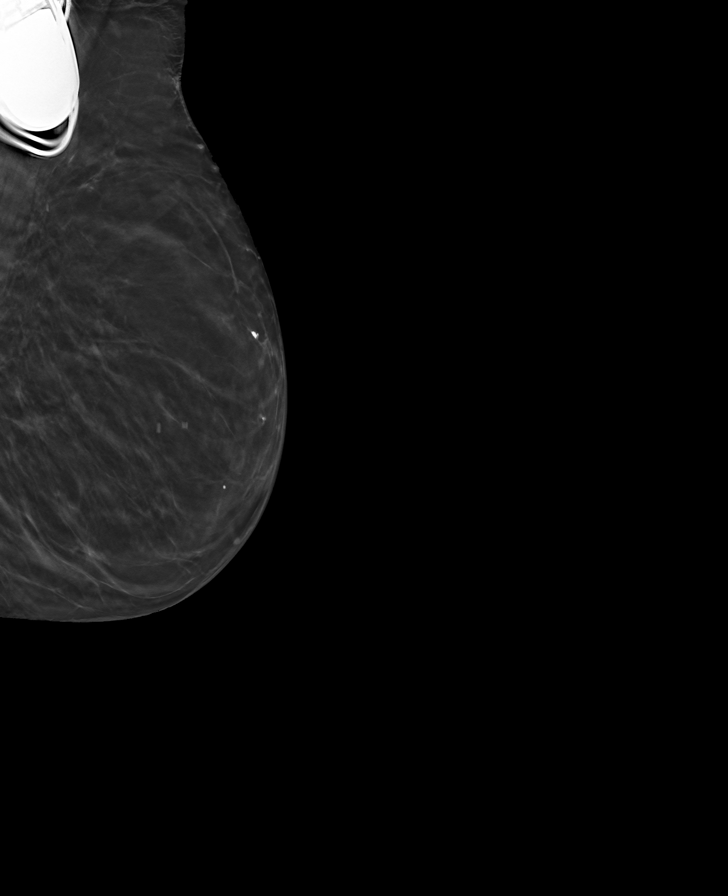

[L CC tomo · tomo slice 30/59.0]
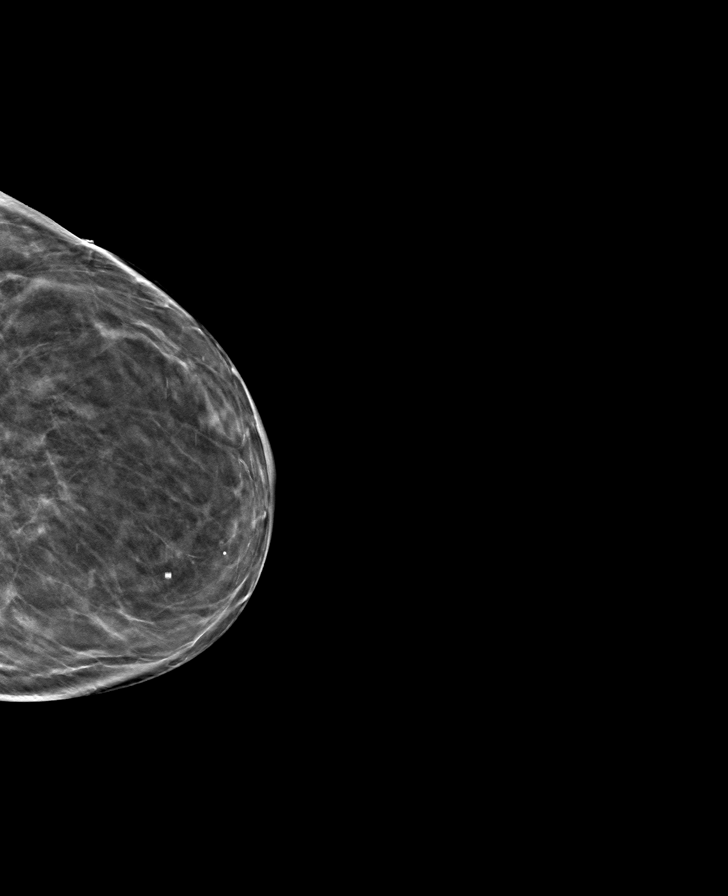

[R MLO tomo · tomo slice 29/56.0]
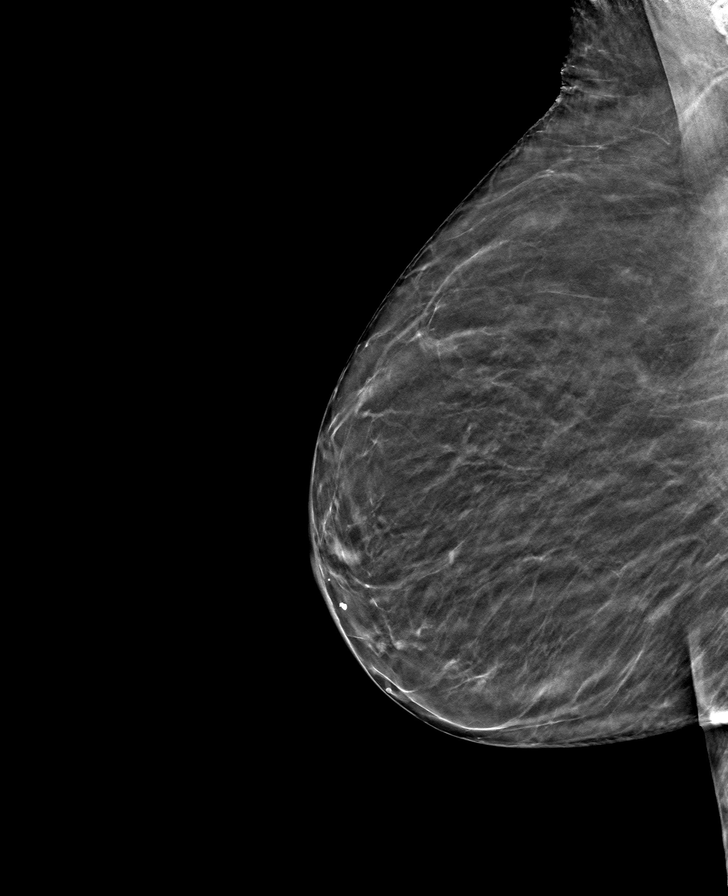

[R CC tomo · tomo slice 27/52.0]
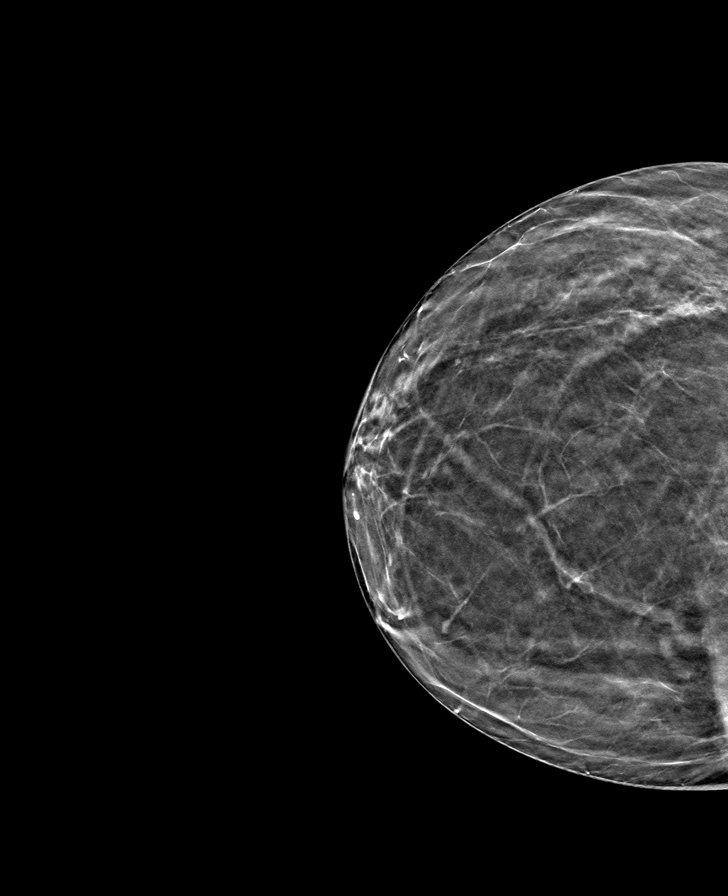

[8 of 24 positions shown; findings below may reference images not displayed]

FINDINGS: In the left breast, calcifications warrant further evaluation. In
the right breast, no findings suspicious for malignancy. Images were
processed with CAD.
IMPRESSION: Further evaluation is suggested for calcifications in the left
breast.

RECOMMENDATION:
Diagnostic mammogram of the left breast. (Code:61-D-66I)

The patient will be contacted regarding the findings, and additional
imaging will be scheduled.

BI-RADS CATEGORY  0: Incomplete. Need additional imaging evaluation
and/or prior mammograms for comparison.

## 2019-10-19 DIAGNOSIS — K620 Anal polyp: Secondary | ICD-10-CM | POA: Diagnosis not present

## 2019-10-19 DIAGNOSIS — K625 Hemorrhage of anus and rectum: Secondary | ICD-10-CM | POA: Diagnosis not present

## 2019-10-24 DIAGNOSIS — E7800 Pure hypercholesterolemia, unspecified: Secondary | ICD-10-CM | POA: Diagnosis not present

## 2019-10-24 DIAGNOSIS — I10 Essential (primary) hypertension: Secondary | ICD-10-CM | POA: Diagnosis not present

## 2019-10-24 DIAGNOSIS — D649 Anemia, unspecified: Secondary | ICD-10-CM | POA: Diagnosis not present

## 2019-10-24 DIAGNOSIS — N183 Chronic kidney disease, stage 3 unspecified: Secondary | ICD-10-CM | POA: Diagnosis not present

## 2019-10-24 DIAGNOSIS — M810 Age-related osteoporosis without current pathological fracture: Secondary | ICD-10-CM | POA: Diagnosis not present

## 2019-10-24 DIAGNOSIS — Z853 Personal history of malignant neoplasm of breast: Secondary | ICD-10-CM | POA: Diagnosis not present

## 2019-10-27 DIAGNOSIS — M79652 Pain in left thigh: Secondary | ICD-10-CM | POA: Diagnosis not present

## 2019-10-27 DIAGNOSIS — M25552 Pain in left hip: Secondary | ICD-10-CM | POA: Diagnosis not present

## 2019-10-27 DIAGNOSIS — Z96642 Presence of left artificial hip joint: Secondary | ICD-10-CM | POA: Diagnosis not present

## 2019-11-20 ENCOUNTER — Ambulatory Visit (INDEPENDENT_AMBULATORY_CARE_PROVIDER_SITE_OTHER): Payer: Medicare HMO | Admitting: *Deleted

## 2019-11-20 DIAGNOSIS — Z950 Presence of cardiac pacemaker: Secondary | ICD-10-CM

## 2019-11-20 LAB — CUP PACEART REMOTE DEVICE CHECK
Battery Impedance: 258 Ohm
Battery Remaining Longevity: 110 mo
Battery Voltage: 2.79 V
Brady Statistic AP VP Percent: 1 %
Brady Statistic AP VS Percent: 86 %
Brady Statistic AS VP Percent: 0 %
Brady Statistic AS VS Percent: 14 %
Date Time Interrogation Session: 20210208141154
Implantable Lead Implant Date: 20160202
Implantable Lead Implant Date: 20160202
Implantable Lead Location: 753859
Implantable Lead Location: 753860
Implantable Lead Model: 5076
Implantable Lead Model: 5092
Implantable Pulse Generator Implant Date: 20160202
Lead Channel Impedance Value: 386 Ohm
Lead Channel Impedance Value: 649 Ohm
Lead Channel Pacing Threshold Amplitude: 0.375 V
Lead Channel Pacing Threshold Amplitude: 0.5 V
Lead Channel Pacing Threshold Pulse Width: 0.4 ms
Lead Channel Pacing Threshold Pulse Width: 0.4 ms
Lead Channel Setting Pacing Amplitude: 2 V
Lead Channel Setting Pacing Amplitude: 2.5 V
Lead Channel Setting Pacing Pulse Width: 0.4 ms
Lead Channel Setting Sensing Sensitivity: 4 mV

## 2019-11-21 NOTE — Progress Notes (Signed)
PPM Remote  

## 2019-11-27 DIAGNOSIS — Z1159 Encounter for screening for other viral diseases: Secondary | ICD-10-CM | POA: Diagnosis not present

## 2019-11-29 DIAGNOSIS — M7062 Trochanteric bursitis, left hip: Secondary | ICD-10-CM | POA: Diagnosis not present

## 2019-11-29 DIAGNOSIS — M25552 Pain in left hip: Secondary | ICD-10-CM | POA: Diagnosis not present

## 2019-12-14 DIAGNOSIS — Z1159 Encounter for screening for other viral diseases: Secondary | ICD-10-CM | POA: Diagnosis not present

## 2019-12-19 ENCOUNTER — Encounter: Payer: Self-pay | Admitting: Oncology

## 2019-12-19 ENCOUNTER — Encounter: Payer: Medicare HMO | Admitting: Physician Assistant

## 2019-12-19 DIAGNOSIS — C785 Secondary malignant neoplasm of large intestine and rectum: Secondary | ICD-10-CM | POA: Diagnosis not present

## 2019-12-19 DIAGNOSIS — K6289 Other specified diseases of anus and rectum: Secondary | ICD-10-CM | POA: Diagnosis not present

## 2019-12-19 DIAGNOSIS — K625 Hemorrhage of anus and rectum: Secondary | ICD-10-CM | POA: Diagnosis not present

## 2019-12-21 ENCOUNTER — Encounter: Payer: Medicare HMO | Admitting: Physician Assistant

## 2019-12-22 DIAGNOSIS — D490 Neoplasm of unspecified behavior of digestive system: Secondary | ICD-10-CM | POA: Diagnosis not present

## 2019-12-26 DIAGNOSIS — C785 Secondary malignant neoplasm of large intestine and rectum: Secondary | ICD-10-CM | POA: Diagnosis not present

## 2020-01-01 ENCOUNTER — Other Ambulatory Visit: Payer: Self-pay | Admitting: Oncology

## 2020-01-02 ENCOUNTER — Encounter: Payer: Self-pay | Admitting: Oncology

## 2020-01-02 ENCOUNTER — Other Ambulatory Visit: Payer: Self-pay | Admitting: Oncology

## 2020-01-02 DIAGNOSIS — N183 Chronic kidney disease, stage 3 unspecified: Secondary | ICD-10-CM | POA: Diagnosis not present

## 2020-01-02 DIAGNOSIS — M810 Age-related osteoporosis without current pathological fracture: Secondary | ICD-10-CM | POA: Diagnosis not present

## 2020-01-02 DIAGNOSIS — C20 Malignant neoplasm of rectum: Secondary | ICD-10-CM

## 2020-01-02 DIAGNOSIS — I10 Essential (primary) hypertension: Secondary | ICD-10-CM | POA: Diagnosis not present

## 2020-01-02 DIAGNOSIS — D649 Anemia, unspecified: Secondary | ICD-10-CM | POA: Diagnosis not present

## 2020-01-02 DIAGNOSIS — E7800 Pure hypercholesterolemia, unspecified: Secondary | ICD-10-CM | POA: Diagnosis not present

## 2020-01-02 DIAGNOSIS — Z853 Personal history of malignant neoplasm of breast: Secondary | ICD-10-CM | POA: Diagnosis not present

## 2020-01-02 NOTE — Progress Notes (Signed)
I reviewed the pathology from Dr. Buccini's rectal biopsy 12/19/2019.  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  I have discussed the case briefly with Dr. Alen Blew and he is willing to see the patient for this indication.  He suggested we also refer to Dr. Marcello Moores.  I have placed that referral and I have also set the patient up for a PET scan.  Called Ms. Alton today.  She is aware of all of the above and in agreement with this plan.  I will be glad to see her on an as-needed basis as far as her breast cancer is concerned

## 2020-01-03 ENCOUNTER — Telehealth: Payer: Self-pay | Admitting: Oncology

## 2020-01-03 NOTE — Telephone Encounter (Signed)
Called pt to confirm appt on 4/8 - pt is aware

## 2020-01-12 ENCOUNTER — Other Ambulatory Visit: Payer: Self-pay

## 2020-01-12 ENCOUNTER — Encounter (HOSPITAL_COMMUNITY)
Admission: RE | Admit: 2020-01-12 | Discharge: 2020-01-12 | Disposition: A | Discharge status: Home / Self Care | Payer: Medicare HMO | Source: Ambulatory Visit | Attending: Oncology | Admitting: Oncology

## 2020-01-12 DIAGNOSIS — I2510 Atherosclerotic heart disease of native coronary artery without angina pectoris: Secondary | ICD-10-CM | POA: Diagnosis not present

## 2020-01-12 DIAGNOSIS — I700 Atherosclerosis of aorta: Secondary | ICD-10-CM | POA: Diagnosis not present

## 2020-01-12 DIAGNOSIS — K8020 Calculus of gallbladder without cholecystitis without obstruction: Secondary | ICD-10-CM | POA: Insufficient documentation

## 2020-01-12 DIAGNOSIS — C787 Secondary malignant neoplasm of liver and intrahepatic bile duct: Secondary | ICD-10-CM | POA: Insufficient documentation

## 2020-01-12 DIAGNOSIS — C7801 Secondary malignant neoplasm of right lung: Secondary | ICD-10-CM | POA: Insufficient documentation

## 2020-01-12 DIAGNOSIS — C778 Secondary and unspecified malignant neoplasm of lymph nodes of multiple regions: Secondary | ICD-10-CM | POA: Diagnosis not present

## 2020-01-12 DIAGNOSIS — C7802 Secondary malignant neoplasm of left lung: Secondary | ICD-10-CM | POA: Insufficient documentation

## 2020-01-12 DIAGNOSIS — K5730 Diverticulosis of large intestine without perforation or abscess without bleeding: Secondary | ICD-10-CM | POA: Diagnosis not present

## 2020-01-12 DIAGNOSIS — C4351 Malignant melanoma of anal skin: Secondary | ICD-10-CM | POA: Diagnosis not present

## 2020-01-12 DIAGNOSIS — C20 Malignant neoplasm of rectum: Secondary | ICD-10-CM | POA: Diagnosis not present

## 2020-01-12 DIAGNOSIS — C7800 Secondary malignant neoplasm of unspecified lung: Secondary | ICD-10-CM | POA: Diagnosis not present

## 2020-01-12 LAB — GLUCOSE, CAPILLARY: Glucose-Capillary: 83 mg/dL (ref 70–99)

## 2020-01-12 MED ORDER — FLUDEOXYGLUCOSE F - 18 (FDG) INJECTION
6.4000 | Freq: Once | INTRAVENOUS | Status: AC
  Administered 2020-01-12: 6.4 via INTRAVENOUS

## 2020-01-15 NOTE — Progress Notes (Signed)
Cardiology Office Note Date:  01/17/2020  Patient ID:  [name], [name] 1929/05/27, MRN [id number] PCP:  Leighton Ruff, MD  Cardiologist:  Dr. Agustin Cree (previously Dr. Wynonia Lawman) EP: Dr. Rayann Heman     Chief Complaint: annual visit  History of Present Illness: [name] is a 86 y.o. female with history of HTN, HLD, CKD (III), CHB w/PPM  She comes in today to be seen for Dr. Rayann Heman, last seen by him march 2020.  She was feeling well, pacer working normally, no changes were made.  She was conducting in her own that day.  She is doing well.  Lives at [facility] and since the residents have been vaccinated now, exercise classes are back in person, she likes this much better then the streamed option.  Going to Yoga daily and also some with chair exercises and balance training.  She fell a couple weeks ago, woke in the middle of the night to go to the BR, says she tends to be a little off balance since her b/l hips were done, and has a bad knee too, she did not faint or have near syncope.  She fell on her hip, thankfully did not get hurt.  Denies any CP, palpitations or cardiac awareness, no SOB, DOE No dizzy spells, near syncope or syncope.  She mentions not having much of an appetite and has lost weight She sees her PMD next week for physical and labs  Device information MDT dual chamber PPM implanted 11/13/2014   Past Medical History:  Diagnosis Date  . Anemia   . Angina pectoris ([place]) 07/22/2018   Clinically diagnosed June 2019 and treted medically, abnormal EKG at that time.   . Anginal pain ([place])   . Arthritis   . Breast cancer ([place])   . Cardiac pacemaker in situ    Inserted 11/13/14 symptomatic complete heart block  Medtronic Adapta L model ADDRL 1 (serial number [id number])   RA lead  Medtronic model [id number] (serial number [id number])   RV lead  Medtronic model 5092- 72 (serial number [id number])     . Chronic kidney disease   . Complete heart block (HCC)    a.  s/p MDT dual chamber pacemaker Dr Rayann Heman  . Dizziness and giddiness   . Family history of breast cancer   . Family history of lung cancer   . Genetic testing 08/01/2018   The Common Hereditary Cancer Panel offered by [facility] includes sequencing and/or deletion duplication testing of the following 55 genes: APC, ATM, AXIN2, BARD1, BLM, BMPR1A, BRCA1, BRCA2, BRIP1, BUB1B, CDH1, CDK4, CDKN2A, CEP57, CHEK2, CTNNA1, DICER1, ENG, EPCAM, GALNT12, GREM1, HOXB13, KIT, MEN1, MLH1, MLH3, MSH2, MSH3, MSH6, MUTYH, NBN, NF1, NTHL1, PALB2, PDGFRA, PMS2, POLD1, POLE, PTEN, RAD50,   . Hypercholesterolemia 11/12/2014  . Hyperlipidemia   . Hypertension   . Kidney disease, chronic, stage III (GFR 30-59 ml/min)   . Malignant neoplasm of upper-outer quadrant of left breast in female, estrogen receptor positive ([place]) 05/25/2018  . Osteoporosis   . Peripheral vascular disease ([place])   . Personal history of breast cancer 07/19/2018  . Personal history of radiation therapy 1985   Left Breast Cancer  . Presence of permanent cardiac pacemaker   . Vertigo     Past Surgical History:  Procedure Laterality Date  . ABDOMINAL HYSTERECTOMY    . APPENDECTOMY    . BREAST LUMPECTOMY Left 1985  . BREAST LUMPECTOMY WITH RADIOACTIVE SEED LOCALIZATION Left 05/16/2018   Procedure: BREAST LUMPECTOMY  WITH RADIOACTIVE SEED LOCALIZATION;  Surgeon: Excell Seltzer, MD;  Location: [facility];  Service: General;  Laterality: Left;  . EXCISION MORTON'S NEUROMA Bilateral   . GANGLION CYST EXCISION Left   . JOINT REPLACEMENT Right 2010   knee  . PACEMAKER INSERTION  11/13/14   MDT dual chamber pacemaker implanted by Dr Rayann Heman for CHB  . PERMANENT PACEMAKER INSERTION N/A 11/13/2014   Medtronic Adapta L dual-chamber pacemaker by Dr Rayann Heman for symptomatic transient complete heart block  . TOTAL HIP ARTHROPLASTY Bilateral     Current Outpatient Medications  Medication Sig Dispense Refill  . anastrozole (ARIMIDEX) 1 MG tablet Take 1 tablet (1 mg  total) by mouth daily. 90 tablet 4  . aspirin EC 81 MG tablet Take 81 mg by mouth daily.    Marland Kitchen atorvastatin (LIPITOR) 20 MG tablet Take 20 mg by mouth every evening.    . Calcium Carb-Cholecalciferol (CALCIUM 600 + D PO) Take 1 tablet by mouth 2 (two) times daily.     . Coenzyme Q10-Vitamin E (QUNOL ULTRA COQ10 PO) Take 1 capsule by mouth every evening.    . fexofenadine (ALLEGRA) 180 MG tablet Take 180 mg by mouth daily.    Marland Kitchen ketotifen (ZADITOR) 0.025 % ophthalmic solution Place 1 drop into both eyes 2 (two) times daily as needed (for itchy eyes.).    Marland Kitchen losartan (COZAAR) 50 MG tablet Take 50 mg by mouth every evening.     . magnesium oxide (MAG-OX) 400 MG tablet Take 400 mg by mouth daily.    . Melatonin 3 MG TABS Take 6 mg by mouth at bedtime.    . metoprolol succinate (TOPROL-XL) 25 MG 24 hr tablet Take 25 mg by mouth daily.    . Multiple Vitamin (MULTIVITAMIN WITH MINERALS) TABS tablet Take 1 tablet by mouth daily. Multivitamin for 50+    . nitroGLYCERIN (NITROSTAT) 0.4 MG SL tablet Place 0.4 mg under the tongue every 5 (five) minutes x 3 doses as needed. Chest pain    . traMADol (ULTRAM) 50 MG tablet Take 1 tablet (50 mg total) by mouth every 6 (six) hours as needed. 10 tablet 1  . TURMERIC PO Take 1 capsule by mouth daily.     No current facility-administered medications for this visit.    Allergies:   Molds & smuts   Social History:  The patient  reports that she has never smoked. She has never used smokeless tobacco. She reports current alcohol use. She reports that she does not use drugs.   Family History:  The patient's family history includes Alcoholism in her brother and father; Breast cancer (age of onset: 78) in her sister; Breast cancer (age of onset: 29) in her sister; CVA in her mother; Cancer in her maternal aunt; Cirrhosis in her brother; Hyperlipidemia in her mother; Hypertension in her mother; Other (age of onset: 82) in her father; Transient ischemic attack in her mother;  Tuberculosis in her paternal grandfather.  ROS:  Please see the history of present illness.  All other systems are reviewed and otherwise negative.   PHYSICAL EXAM:  VS:  BP 128/68   Pulse (!) 106   Ht '5\' 1"'$  (1.549 m)   Wt 118 lb (53.5 kg)   BMI 22.30 kg/m  BMI: Body mass index is 22.3 kg/m. Well nourished, well developed, in no acute distress  HEENT: normocephalic, atraumatic  Neck: no JVD, carotid bruits or masses Cardiac:  RRR; no significant murmurs, no rubs, or gallops Lungs:  CTA b/l,  no wheezing, rhonchi or rales  Abd: soft, nontender MS: no deformity, age appropriate atrophy, quite thin body habitus Ext: no edema  Skin: warm and dry, no rash Neuro:  No gross deficits appreciated Psych: euthymic mood, full affect  PPM site is stable, no tethering or discomfort   EKG:  Done today and reviewed by myself shows  AP/VS 106bpm (sensor goes to 120)  PPM interrogation done today and reviewed by myself:  Battery and lead measurements are good HVR and ARH are all 1:1 and brief AT HRV looks good    03/28/2018: TTE LVEF 55-60% LA mildly dilated Mild MR, TR, PI    Recent Labs: No results found for requested labs within last 8760 hours.  No results found for requested labs within last 8760 hours.   CrCl cannot be calculated (Patient's most recent lab result is older than the maximum 21 days allowed.).   Wt Readings from Last 3 Encounters:  01/17/20 118 lb (53.5 kg)  12/19/18 133 lb 12.8 oz (60.7 kg)  11/09/18 133 lb (60.3 kg)     Other studies reviewed: Additional studies/records reviewed today include: summarized above  ASSESSMENT AND PLAN:  1. PPM     Intact function, no programming changes made  2. HTN     Looks OK, no changes today  3. HLD     Monitored and managed by her PMD  4. Poor appetite, weight loos     She sees her PMD next week, she is instructed to make sure she discussed this with her for w/u and management     Disposition: F/u with  remotes every 3 mo, in-clinic in 1 year, sooner if needed  Current medicines are reviewed at length with the patient today.  The patient did not have any concerns regarding medicines.  Venetia Night, PA-C 01/17/2020 2:41 PM     [address] [place] [address] [phone] (office)  [phone] (fax)

## 2020-01-16 ENCOUNTER — Encounter: Payer: Medicare HMO | Admitting: Physician Assistant

## 2020-01-17 ENCOUNTER — Other Ambulatory Visit: Payer: Self-pay

## 2020-01-17 ENCOUNTER — Ambulatory Visit: Payer: Medicare HMO | Admitting: Physician Assistant

## 2020-01-17 VITALS — BP 128/68 | HR 106 | Ht 61.0 in | Wt 118.0 lb

## 2020-01-17 DIAGNOSIS — Z950 Presence of cardiac pacemaker: Secondary | ICD-10-CM | POA: Diagnosis not present

## 2020-01-17 DIAGNOSIS — I10 Essential (primary) hypertension: Secondary | ICD-10-CM

## 2020-01-17 DIAGNOSIS — I209 Angina pectoris, unspecified: Secondary | ICD-10-CM | POA: Diagnosis not present

## 2020-01-17 DIAGNOSIS — E785 Hyperlipidemia, unspecified: Secondary | ICD-10-CM

## 2020-01-17 DIAGNOSIS — R630 Anorexia: Secondary | ICD-10-CM

## 2020-01-17 NOTE — Patient Instructions (Signed)

## 2020-01-18 ENCOUNTER — Other Ambulatory Visit: Payer: Self-pay

## 2020-01-18 ENCOUNTER — Inpatient Hospital Stay: Payer: Medicare HMO | Attending: Oncology | Admitting: Oncology

## 2020-01-18 VITALS — BP 129/69 | HR 92 | Temp 99.1°F | Resp 18 | Ht 61.0 in | Wt 119.3 lb

## 2020-01-18 DIAGNOSIS — Z803 Family history of malignant neoplasm of breast: Secondary | ICD-10-CM

## 2020-01-18 DIAGNOSIS — Z79899 Other long term (current) drug therapy: Secondary | ICD-10-CM

## 2020-01-18 DIAGNOSIS — Z8249 Family history of ischemic heart disease and other diseases of the circulatory system: Secondary | ICD-10-CM

## 2020-01-18 DIAGNOSIS — C7801 Secondary malignant neoplasm of right lung: Secondary | ICD-10-CM

## 2020-01-18 DIAGNOSIS — C787 Secondary malignant neoplasm of liver and intrahepatic bile duct: Secondary | ICD-10-CM | POA: Diagnosis not present

## 2020-01-18 DIAGNOSIS — I10 Essential (primary) hypertension: Secondary | ICD-10-CM | POA: Diagnosis not present

## 2020-01-18 DIAGNOSIS — N189 Chronic kidney disease, unspecified: Secondary | ICD-10-CM

## 2020-01-18 DIAGNOSIS — Z853 Personal history of malignant neoplasm of breast: Secondary | ICD-10-CM

## 2020-01-18 DIAGNOSIS — C7802 Secondary malignant neoplasm of left lung: Secondary | ICD-10-CM | POA: Diagnosis not present

## 2020-01-18 DIAGNOSIS — Z90710 Acquired absence of both cervix and uterus: Secondary | ICD-10-CM

## 2020-01-18 DIAGNOSIS — Z950 Presence of cardiac pacemaker: Secondary | ICD-10-CM

## 2020-01-18 DIAGNOSIS — Z7982 Long term (current) use of aspirin: Secondary | ICD-10-CM

## 2020-01-18 DIAGNOSIS — Z8349 Family history of other endocrine, nutritional and metabolic diseases: Secondary | ICD-10-CM

## 2020-01-18 DIAGNOSIS — E079 Disorder of thyroid, unspecified: Secondary | ICD-10-CM | POA: Diagnosis not present

## 2020-01-18 DIAGNOSIS — C20 Malignant neoplasm of rectum: Secondary | ICD-10-CM | POA: Diagnosis not present

## 2020-01-18 DIAGNOSIS — Z8262 Family history of osteoporosis: Secondary | ICD-10-CM

## 2020-01-18 DIAGNOSIS — Z801 Family history of malignant neoplasm of trachea, bronchus and lung: Secondary | ICD-10-CM

## 2020-01-18 DIAGNOSIS — Z923 Personal history of irradiation: Secondary | ICD-10-CM

## 2020-01-18 NOTE — Addendum Note (Signed)
Addended by: Claude Manges on: 01/18/2020 01:55 PM   Modules accepted: Orders

## 2020-01-18 NOTE — Progress Notes (Signed)
Reason for the request:    Melanoma  HPI: I was asked by Dr. Cristina Gong  to evaluate [name] for new diagnosis of melanoma.  She is a 86 year old woman with history of hypertension and cardiac arrhythmia as well as history of breast cancer.  She was diagnosed with left-sided breast cancer 1989 and underwent lumpectomy and lymph node dissection followed by radiation without any additional adjuvant therapy.  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  She underwent left lumpectomy on May 16, 2018.  She was started on aromatase inhibitors briefly but was discontinued under the care of Dr. Jana Hakim.  She is currently on active surveillance regarding her breast cancer.  She has been experiencing intermittent hematochezia for the last 1 to 2 years.  She was evaluated by Dr. Cristina Gong in March 2021 for a rectal mass and underwent a flexible sigmoidoscopy at that time.  The sigmoidoscopy revealed a 4 cm firm nodule rectal mass that is fungating and ulcerated but not obstructing.  Biopsy obtained at that time and showed an ulcerated malignant melanoma.  This was completed on December 19, 2019.    PET CT scan obtained on January 12, 2020 showed hypermetabolic uptake in the lower rectum associated with bilateral perirectal nodal metastasis, porta hepatis nodal metastasis, liver metastasis as well as pulmonary metastasis.   Clinically, she reports no major complaints at this time.  She has reported decline in her appetite and some weight loss.  She has reported occasional issues with insomnia but no back pain or discomfort.  She does report some occasional pelvic pain.  She still ambulates with the help of a walker without any recent falls or syncope.  She denies any difficulties with moving her bowels at this time.    She does not report any headaches, blurry vision, syncope or seizures. Does not report any fevers, chills or sweats.  Does not report any cough, wheezing or hemoptysis.  Does not report any  chest pain, palpitation, orthopnea or leg edema.  Does not report any nausea, vomiting or abdominal pain.  Does not report any constipation or diarrhea.  Does not report any skeletal complaints.    Does not report frequency, urgency or hematuria.  Does not report any skin rashes or lesions. Does not report any heat or cold intolerance.  Does not report any lymphadenopathy or petechiae.  Does not report any anxiety or depression.  Remaining review of systems is negative.    Past Medical History:  Diagnosis Date  . Anemia   . Angina pectoris ([place]) 07/22/2018   Clinically diagnosed June 2019 and treted medically, abnormal EKG at that time.   . Anginal pain ([place])   . Arthritis   . Breast cancer ([place])   . Cardiac pacemaker in situ    Inserted 11/13/14 symptomatic complete heart block  Medtronic Adapta L model ADDRL 1 (serial number [id number])   RA lead  Medtronic model [id number] (serial number [id number])   RV lead  Medtronic model 5092- 91 (serial number [id number])     . Chronic kidney disease   . Complete heart block (HCC)    a. s/p MDT dual chamber pacemaker Dr Rayann Heman  . Dizziness and giddiness   . Family history of breast cancer   . Family history of lung cancer   . Genetic testing 08/01/2018   The Common Hereditary Cancer Panel offered by [facility] includes sequencing and/or deletion duplication testing of the following 55 genes: APC, ATM, AXIN2, BARD1,  BLM, BMPR1A, BRCA1, BRCA2, BRIP1, BUB1B, CDH1, CDK4, CDKN2A, CEP57, CHEK2, CTNNA1, DICER1, ENG, EPCAM, GALNT12, GREM1, HOXB13, KIT, MEN1, MLH1, MLH3, MSH2, MSH3, MSH6, MUTYH, NBN, NF1, NTHL1, PALB2, PDGFRA, PMS2, POLD1, POLE, PTEN, RAD50,   . Hypercholesterolemia 11/12/2014  . Hyperlipidemia   . Hypertension   . Kidney disease, chronic, stage III (GFR 30-59 ml/min)   . Malignant neoplasm of upper-outer quadrant of left breast in female, estrogen receptor positive ([place]) 05/25/2018  . Osteoporosis   . Peripheral vascular disease ([place])   .  Personal history of breast cancer 07/19/2018  . Personal history of radiation therapy 1985   Left Breast Cancer  . Presence of permanent cardiac pacemaker   . Vertigo   :  Past Surgical History:  Procedure Laterality Date  . ABDOMINAL HYSTERECTOMY    . APPENDECTOMY    . BREAST LUMPECTOMY Left 1985  . BREAST LUMPECTOMY WITH RADIOACTIVE SEED LOCALIZATION Left 05/16/2018   Procedure: BREAST LUMPECTOMY WITH RADIOACTIVE SEED LOCALIZATION;  Surgeon: Excell Seltzer, MD;  Location: [place];  Service: General;  Laterality: Left;  . EXCISION MORTON'S NEUROMA Bilateral   . GANGLION CYST EXCISION Left   . JOINT REPLACEMENT Right 2010   knee  . PACEMAKER INSERTION  11/13/14   MDT dual chamber pacemaker implanted by Dr Rayann Heman for CHB  . PERMANENT PACEMAKER INSERTION N/A 11/13/2014   Medtronic Adapta L dual-chamber pacemaker by Dr Rayann Heman for symptomatic transient complete heart block  . TOTAL HIP ARTHROPLASTY Bilateral   :   Current Outpatient Medications:  .  anastrozole (ARIMIDEX) 1 MG tablet, Take 1 tablet (1 mg total) by mouth daily., Disp: 90 tablet, Rfl: 4 .  aspirin EC 81 MG tablet, Take 81 mg by mouth daily., Disp: , Rfl:  .  atorvastatin (LIPITOR) 20 MG tablet, Take 20 mg by mouth every evening., Disp: , Rfl:  .  Calcium Carb-Cholecalciferol (CALCIUM 600 + D PO), Take 1 tablet by mouth 2 (two) times daily. , Disp: , Rfl:  .  Coenzyme Q10-Vitamin E (QUNOL ULTRA COQ10 PO), Take 1 capsule by mouth every evening., Disp: , Rfl:  .  fexofenadine (ALLEGRA) 180 MG tablet, Take 180 mg by mouth daily., Disp: , Rfl:  .  ketotifen (ZADITOR) 0.025 % ophthalmic solution, Place 1 drop into both eyes 2 (two) times daily as needed (for itchy eyes.)., Disp: , Rfl:  .  losartan (COZAAR) 50 MG tablet, Take 50 mg by mouth every evening. , Disp: , Rfl:  .  magnesium oxide (MAG-OX) 400 MG tablet, Take 400 mg by mouth daily., Disp: , Rfl:  .  Melatonin 3 MG TABS, Take 6 mg by mouth at bedtime., Disp: , Rfl:  .   metoprolol succinate (TOPROL-XL) 25 MG 24 hr tablet, Take 25 mg by mouth daily., Disp: , Rfl:  .  Multiple Vitamin (MULTIVITAMIN WITH MINERALS) TABS tablet, Take 1 tablet by mouth daily. Multivitamin for 50+, Disp: , Rfl:  .  nitroGLYCERIN (NITROSTAT) 0.4 MG SL tablet, Place 0.4 mg under the tongue every 5 (five) minutes x 3 doses as needed. Chest pain, Disp: , Rfl:  .  traMADol (ULTRAM) 50 MG tablet, Take 1 tablet (50 mg total) by mouth every 6 (six) hours as needed., Disp: 10 tablet, Rfl: 1 .  TURMERIC PO, Take 1 capsule by mouth daily., Disp: , Rfl: :  Allergies  Allergen Reactions  . Molds & Smuts Hives  :  Family History  Problem Relation Age of Onset  . Hypertension Mother  possibly per pt  . Hyperlipidemia Mother        possibly per pt  . Transient ischemic attack Mother        multiple  . CVA Mother   . Alcoholism Father   . Other Father 22       cerebral hemorrhage  . Cirrhosis Brother   . Alcoholism Brother   . Cancer Maternal Aunt        lung, hx smoking  . Breast cancer Sister 49  . Breast cancer Sister 12       recurred at 53  . Tuberculosis Paternal Grandfather   :  Social History   Socioeconomic History  . Marital status: Divorced    Spouse name: Not on file  . Number of children: Not on file  . Years of education: Not on file  . Highest education level: Not on file  Occupational History  . Not on file  Tobacco Use  . Smoking status: Never Smoker  . Smokeless tobacco: Never Used  Substance and Sexual Activity  . Alcohol use: Yes    Comment: rarely  . Drug use: No  . Sexual activity: Not on file  Other Topics Concern  . Not on file  Social History Narrative  . Not on file   Social Determinants of Health   Financial Resource Strain:   . Difficulty of Paying Living Expenses:   Food Insecurity:   . Worried About Charity fundraiser in the Last Year:   . Arboriculturist in the Last Year:   Transportation Needs:   . Film/video editor  (Medical):   Marland Kitchen Lack of Transportation (Non-Medical):   Physical Activity:   . Days of Exercise per Week:   . Minutes of Exercise per Session:   Stress:   . Feeling of Stress :   Social Connections:   . Frequency of Communication with Friends and Family:   . Frequency of Social Gatherings with Friends and Family:   . Attends Religious Services:   . Active Member of Clubs or Organizations:   . Attends Archivist Meetings:   Marland Kitchen Marital Status:   Intimate Partner Violence:   . Fear of Current or Ex-Partner:   . Emotionally Abused:   Marland Kitchen Physically Abused:   . Sexually Abused:   :  Pertinent items are noted in HPI.  Exam: Blood pressure 129/69, pulse 92, temperature 99.1 F (37.3 C), temperature source Temporal, resp. rate 18, height 5' 1" (1.549 m), weight 119 lb 4.8 oz (54.1 kg), SpO2 98 %.   ECOG 1 General appearance: alert and cooperative appeared without distress. Head: atraumatic without any abnormalities. Eyes: conjunctivae/corneas clear. PERRL.  Sclera anicteric. Throat: lips, mucosa, and tongue normal; without oral thrush or ulcers. Resp: clear to auscultation bilaterally without rhonchi, wheezes or dullness to percussion. Cardio: regular rate and rhythm, S1, S2 normal, no murmur, click, rub or gallop GI: soft, non-tender; bowel sounds normal; no masses,  no organomegaly Skin: Skin color, texture, turgor normal. No rashes or lesions Lymph nodes: Cervical, supraclavicular, and axillary nodes normal. Neurologic: Grossly normal without any motor, sensory or deep tendon reflexes. Musculoskeletal: No joint deformity or effusion.   NM PET Image Initial (PI) Whole Body  Result Date: 01/12/2020 CLINICAL DATA:  Initial treatment strategy for newly diagnosed rectal melanoma. History of right breast cancer. EXAM: NUCLEAR MEDICINE PET WHOLE BODY TECHNIQUE: 6.4 mCi F-18 FDG was injected intravenously. Full-ring PET imaging was performed from the skull base to  thigh after the  radiotracer. CT data was obtained and used for attenuation correction and anatomic localization. Fasting blood glucose: 83 mg/dl COMPARISON:  11/08/2015 unenhanced CT abdomen/pelvis. FINDINGS: Mediastinal blood pool activity: SUV max 2.9 HEAD/NECK: No hypermetabolic activity in the scalp. No hypermetabolic cervical lymph nodes. Incidental CT findings: none CHEST: Numerous (greater than 10) hypermetabolic solid pulmonary nodules scattered throughout both lungs. Representative 1.4 cm right mid pulmonary nodule along the major fissure with max SUV 10.7 (series 8/image 34). Representative peripheral right lower lobe 0.9 cm solid pulmonary nodule with max SUV 4.1 (series 8/image 41). No enlarged or hypermetabolic axillary, mediastinal or hilar lymph nodes. Incidental CT findings: Surgical clips in the left axilla. Two lead left subclavian pacemaker with lead tips in the right atrium and right ventricular apex. Coronary atherosclerosis. Atherosclerotic nonaneurysmal thoracic aorta. ABDOMEN/PELVIS: Several (at least 10) hypermetabolic liver masses scattered throughout the liver. Representative segment 5 right liver 2.9 x 2.4 cm mass with max SUV 15.3 (series 4/image 125). Representative segment 3 left liver lobe 3.4 x 1.5 cm mass with max SUV 13.2 (series 4/image 127). Representative segment 4A left liver lobe 2.5 x 2.1 cm mass with max SUV 11.3 (series 4/image 115). Enlarged hypermetabolic 1.2 cm porta hepatis node with max SUV 9.0 (series 4/image 125). Intensely hypermetabolic low rectal 4.2 x 3.3 x 6.5 cm mass with max SUV 18.9 (series 4/image 195). Bilateral hypermetabolic perirectal lymph nodes, for example 0.8 cm on the right with max SUV 5.6 (series 4/image 190) and 0.6 cm on the left with max SUV 5.4 (series 4/image 186). No abnormal hypermetabolic activity within the pancreas, adrenal glands, or spleen. Incidental CT findings: Atherosclerotic nonaneurysmal abdominal aorta. Granulomatous calcifications throughout  the liver and spleen. Cholelithiasis. Marked sigmoid diverticulosis. SKELETON: No focal hypermetabolic activity to suggest skeletal metastasis. Incidental CT findings: none EXTREMITIES: No abnormal hypermetabolic activity in the lower extremities. Incidental CT findings: Bilateral total hip arthroplasty. Right knee arthroplasty. IMPRESSION: 1. Intensely hypermetabolic low rectal mass compatible with provided history of rectal melanoma. 2. Hypermetabolic bilateral perirectal nodal metastases. Hypermetabolic porta hepatis nodal metastasis. 3. Numerous hypermetabolic liver metastases. 4. Numerous hypermetabolic bilateral pulmonary metastases. 5. Chronic findings include: Aortic Atherosclerosis (ICD10-I70.0). Coronary atherosclerosis. Cholelithiasis. Marked sigmoid diverticulosis. Electronically Signed   By: Ilona Sorrel M.D.   On: 01/12/2020 16:53    Assessment and Plan:   86 year old woman with:  1.  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  She was found to have an ulcerated, fungating but not obstructing medium-sized mass in the distal rectum just inside the anal verge with a biopsy proven to show malignant melanoma.  PET CT scan showed disease spread including lymphadenopathy as well as visceral metastasis in the lung and liver.  The natural course of this disease was reviewed today with the patient in detail.  This represent stage IV disease arising from mucosal melanoma which is a rather aggressive presentation.  This disease is incurable and any treatment would be potentially palliative at best.  Treatment options for this condition would include immunotherapy or there would be single agent or combination versus oral targeted therapy.  BRAF and KIT mutation are rare but NF1, NRAS and KRAS are potential targetable mutations.  Her performance status is reasonable and should be reasonable candidate to treat with immunotherapy.  Complications include fatigue, tiredness, thyroid disease as  well as other immune mediated complications.  These include pneumonitis, colitis and arthritis.  She understands the response rate associated with mucosal melanoma to be different  than continue surveillance.  After discussion today she will consider this option and will make a decision in the near future.  We will also update her brain imaging to rule out brain metastasis.  She would be inclined not to receive any systemic treatment if she has CNS metastasis.  2.  Immune mediated complications: These were reiterated and will be monitored closely on immunotherapy she receives it.  These include pneumonitis, colitis, thyroid disease as well as dermatitis.  3. IV access: Peripheral veins will be in use Port-A-Cath will be deferred.   4.  Antiemetics: Prescription for Compazine will be made available to her.  5.  Prognosis and goals of care: Her prognosis is poor with limited life expectancy.  Her performance status is reasonable given the extent of her metastatic disease anticipated life expectancy of less than 6 months without treatment and may be more than that slightly with treatment.   6.  Follow-up: Will be in the near future after she makes a decision regarding treatment.  60  minutes were derential diagnosis and answering questions regarding future plan.edicated to this visit. The time was spent on reviewing laboratory data, imaging studies, discussing treatment options, and reviewing overall prognosis.     A copy of this consult has been forwarded to the requesting physician.

## 2020-01-22 DIAGNOSIS — C439 Malignant melanoma of skin, unspecified: Secondary | ICD-10-CM | POA: Diagnosis not present

## 2020-01-22 DIAGNOSIS — D490 Neoplasm of unspecified behavior of digestive system: Secondary | ICD-10-CM | POA: Diagnosis not present

## 2020-01-22 DIAGNOSIS — I10 Essential (primary) hypertension: Secondary | ICD-10-CM | POA: Diagnosis not present

## 2020-01-22 DIAGNOSIS — Z950 Presence of cardiac pacemaker: Secondary | ICD-10-CM | POA: Diagnosis not present

## 2020-01-22 DIAGNOSIS — N183 Chronic kidney disease, stage 3 unspecified: Secondary | ICD-10-CM | POA: Diagnosis not present

## 2020-01-22 DIAGNOSIS — D649 Anemia, unspecified: Secondary | ICD-10-CM | POA: Diagnosis not present

## 2020-01-22 DIAGNOSIS — E559 Vitamin D deficiency, unspecified: Secondary | ICD-10-CM | POA: Diagnosis not present

## 2020-01-22 DIAGNOSIS — I442 Atrioventricular block, complete: Secondary | ICD-10-CM | POA: Diagnosis not present

## 2020-01-23 ENCOUNTER — Other Ambulatory Visit: Payer: Self-pay

## 2020-01-23 ENCOUNTER — Other Ambulatory Visit: Payer: Self-pay | Admitting: Oncology

## 2020-01-23 DIAGNOSIS — C20 Malignant neoplasm of rectum: Secondary | ICD-10-CM

## 2020-01-23 MED ORDER — MEGESTROL ACETATE 400 MG/10ML PO SUSP: 400.0000 mg | Freq: Every day | ORAL | 1 refills | Status: DC

## 2020-01-24 ENCOUNTER — Other Ambulatory Visit: Payer: Self-pay | Admitting: Oncology

## 2020-01-24 NOTE — Progress Notes (Signed)
Her case was discussed today over the phone as she is experiencing desire not to have any further testing.  At this time, she has decided not to receive any anticancer treatment moving forward.  Understands her prognosis is poor and not willing to undergo any further treatment or testing.  I do feel that this is a reasonable decision given her overall prognosis with this advanced malignancy.  We will cancel imaging studies, laboratory data and will provide supportive care only.  We also discussed the role of hospice which I urged her to consider and will happy to arrange for when she decides on.

## 2020-01-25 ENCOUNTER — Ambulatory Visit (HOSPITAL_COMMUNITY): Admission: RE | Admit: 2020-01-25 | Payer: Medicare HMO | Source: Ambulatory Visit

## 2020-01-25 ENCOUNTER — Other Ambulatory Visit: Payer: Medicare HMO

## 2020-02-05 ENCOUNTER — Telehealth: Payer: Self-pay

## 2020-02-05 NOTE — Telephone Encounter (Signed)
Received voicemail from patient stating that Dr Alen Blew was suppose to be helping her get up with Hospice. Returned patient phone call and let her know that Dr Alen Blew was out of the office today but that I would be sure to get her message over to him. Patient verbalized understanding. Note left on Dr Danise Mina  Desk.

## 2020-02-08 ENCOUNTER — Telehealth: Payer: Self-pay

## 2020-02-08 NOTE — Telephone Encounter (Signed)
Patient called office requesting referral for Hospice services. Verbal order received from Dr. Alen Blew for [facility] and Dr. Alen Blew agreed to be the attending physician. Called and spoke to Amy at [facility] and patient information was given. Patient informed to expect a call from [facility] to set up Hospice visits.

## 2020-02-19 ENCOUNTER — Ambulatory Visit (INDEPENDENT_AMBULATORY_CARE_PROVIDER_SITE_OTHER): Payer: Medicare HMO | Admitting: *Deleted

## 2020-02-19 DIAGNOSIS — I442 Atrioventricular block, complete: Secondary | ICD-10-CM | POA: Diagnosis not present

## 2020-02-20 LAB — CUP PACEART REMOTE DEVICE CHECK
Battery Impedance: 283 Ohm
Battery Remaining Longevity: 113 mo
Battery Voltage: 2.78 V
Brady Statistic AP VP Percent: 2 %
Brady Statistic AP VS Percent: 46 %
Brady Statistic AS VP Percent: 0 %
Brady Statistic AS VS Percent: 53 %
Date Time Interrogation Session: 20210510201214
Implantable Lead Implant Date: 20160202
Implantable Lead Implant Date: 20160202
Implantable Lead Location: 753859
Implantable Lead Location: 753860
Implantable Lead Model: 5076
Implantable Lead Model: 5092
Implantable Pulse Generator Implant Date: 20160202
Lead Channel Impedance Value: 376 Ohm
Lead Channel Impedance Value: 564 Ohm
Lead Channel Pacing Threshold Amplitude: 0.375 V
Lead Channel Pacing Threshold Amplitude: 0.5 V
Lead Channel Pacing Threshold Pulse Width: 0.4 ms
Lead Channel Pacing Threshold Pulse Width: 0.4 ms
Lead Channel Setting Pacing Amplitude: 2 V
Lead Channel Setting Pacing Amplitude: 2.5 V
Lead Channel Setting Pacing Pulse Width: 0.4 ms
Lead Channel Setting Sensing Sensitivity: 4 mV

## 2020-02-21 NOTE — Progress Notes (Signed)
Remote pacemaker transmission.   

## 2020-02-29 DIAGNOSIS — D649 Anemia, unspecified: Secondary | ICD-10-CM | POA: Diagnosis not present

## 2020-02-29 DIAGNOSIS — M810 Age-related osteoporosis without current pathological fracture: Secondary | ICD-10-CM | POA: Diagnosis not present

## 2020-02-29 DIAGNOSIS — N183 Chronic kidney disease, stage 3 unspecified: Secondary | ICD-10-CM | POA: Diagnosis not present

## 2020-02-29 DIAGNOSIS — Z853 Personal history of malignant neoplasm of breast: Secondary | ICD-10-CM | POA: Diagnosis not present

## 2020-02-29 DIAGNOSIS — I10 Essential (primary) hypertension: Secondary | ICD-10-CM | POA: Diagnosis not present

## 2020-02-29 DIAGNOSIS — E7800 Pure hypercholesterolemia, unspecified: Secondary | ICD-10-CM | POA: Diagnosis not present

## 2020-03-07 ENCOUNTER — Telehealth: Payer: Self-pay

## 2020-03-07 ENCOUNTER — Other Ambulatory Visit: Payer: Self-pay

## 2020-03-07 MED ORDER — MEGESTROL ACETATE 400 MG/10ML PO SUSP: 400.0000 mg | Freq: Every day | ORAL | 1 refills | Status: DC

## 2020-03-07 NOTE — Telephone Encounter (Signed)
-----   Message from Wyatt Portela, MD sent at 03/07/2020  3:01 PM EDT ----- Yes. Thanks ----- Message ----- From: Tami Lin, RN Sent: 03/07/2020   2:52 PM EDT To: Wyatt Portela, MD  Hospice nurse called requesting a [facility] refill. Ok to refill? Lanelle Bal

## 2020-03-07 NOTE — Telephone Encounter (Signed)
Refill sent to [facility] on [address].

## 2020-03-19 ENCOUNTER — Other Ambulatory Visit: Payer: Self-pay

## 2020-03-19 MED ORDER — MEGESTROL ACETATE 400 MG/10ML PO SUSP: 400.0000 mg | Freq: Every day | ORAL | 1 refills | Status: AC

## 2020-05-29 ENCOUNTER — Telehealth: Payer: Self-pay | Admitting: *Deleted

## 2020-05-29 NOTE — Telephone Encounter (Signed)
Connected with patient's daughter [name] ([phone]) per her request to notify of FMLA status change to "Continuous" form completion.  Faxed to [facility] [phone]).  "Mail form to my address.   [place], [place], [address] Mom is living with my brother."

## 2020-09-11 DEATH — deceased
